# Patient Record
Sex: Female | Born: 1985 | Race: White | Hispanic: No | Marital: Single | Smoking: Former smoker
Health system: Southern US, Community
[De-identification: ages and names within clinical notes are randomized; demographics above are authoritative.]

## PROBLEM LIST (undated history)

## (undated) DIAGNOSIS — Z8669 Personal history of other diseases of the nervous system and sense organs: Secondary | ICD-10-CM

## (undated) DIAGNOSIS — R519 Headache, unspecified: Secondary | ICD-10-CM

## (undated) DIAGNOSIS — K219 Gastro-esophageal reflux disease without esophagitis: Secondary | ICD-10-CM

## (undated) DIAGNOSIS — F1911 Other psychoactive substance abuse, in remission: Secondary | ICD-10-CM

## (undated) DIAGNOSIS — Z8661 Personal history of infections of the central nervous system: Secondary | ICD-10-CM

## (undated) DIAGNOSIS — Z87448 Personal history of other diseases of urinary system: Secondary | ICD-10-CM

## (undated) DIAGNOSIS — R51 Headache: Secondary | ICD-10-CM

## (undated) DIAGNOSIS — J302 Other seasonal allergic rhinitis: Secondary | ICD-10-CM

## (undated) DIAGNOSIS — Z87440 Personal history of urinary (tract) infections: Secondary | ICD-10-CM

## (undated) HISTORY — DX: Personal history of other diseases of urinary system: Z87.448

## (undated) HISTORY — DX: Personal history of urinary (tract) infections: Z87.440

## (undated) HISTORY — DX: Other seasonal allergic rhinitis: J30.2

## (undated) HISTORY — DX: Personal history of other diseases of the nervous system and sense organs: Z86.69

## (undated) HISTORY — DX: Other psychoactive substance abuse, in remission: F19.11

## (undated) HISTORY — PX: TONSILLECTOMY: SUR1361

## (undated) HISTORY — DX: Gastro-esophageal reflux disease without esophagitis: K21.9

## (undated) HISTORY — DX: Personal history of infections of the central nervous system: Z86.61

## (undated) HISTORY — DX: Headache, unspecified: R51.9

## (undated) HISTORY — DX: Headache: R51

---

## 2007-08-28 ENCOUNTER — Emergency Department: Payer: Self-pay | Admitting: Emergency Medicine

## 2008-09-14 ENCOUNTER — Emergency Department: Payer: Self-pay | Admitting: Emergency Medicine

## 2010-10-05 ENCOUNTER — Ambulatory Visit: Payer: Self-pay | Admitting: Internal Medicine

## 2010-10-19 ENCOUNTER — Ambulatory Visit: Payer: Self-pay | Admitting: Internal Medicine

## 2011-01-06 ENCOUNTER — Inpatient Hospital Stay: Payer: Self-pay | Admitting: Psychiatry

## 2011-06-06 DIAGNOSIS — Z87448 Personal history of other diseases of urinary system: Secondary | ICD-10-CM

## 2011-06-06 HISTORY — DX: Personal history of other diseases of urinary system: Z87.448

## 2013-06-05 DIAGNOSIS — F1911 Other psychoactive substance abuse, in remission: Secondary | ICD-10-CM

## 2013-06-05 HISTORY — DX: Other psychoactive substance abuse, in remission: F19.11

## 2014-07-01 ENCOUNTER — Emergency Department: Payer: Self-pay | Admitting: Emergency Medicine

## 2015-03-02 ENCOUNTER — Ambulatory Visit (INDEPENDENT_AMBULATORY_CARE_PROVIDER_SITE_OTHER): Payer: 59 | Admitting: Family Medicine

## 2015-03-02 ENCOUNTER — Encounter (INDEPENDENT_AMBULATORY_CARE_PROVIDER_SITE_OTHER): Payer: Self-pay

## 2015-03-02 ENCOUNTER — Encounter: Payer: Self-pay | Admitting: Family Medicine

## 2015-03-02 VITALS — BP 108/60 | HR 72 | Temp 98.2°F | Ht 59.0 in | Wt 114.8 lb

## 2015-03-02 DIAGNOSIS — F1911 Other psychoactive substance abuse, in remission: Secondary | ICD-10-CM

## 2015-03-02 DIAGNOSIS — B351 Tinea unguium: Secondary | ICD-10-CM

## 2015-03-02 DIAGNOSIS — M2011 Hallux valgus (acquired), right foot: Secondary | ICD-10-CM

## 2015-03-02 DIAGNOSIS — Q845 Enlarged and hypertrophic nails: Secondary | ICD-10-CM

## 2015-03-02 DIAGNOSIS — F1990 Other psychoactive substance use, unspecified, uncomplicated: Secondary | ICD-10-CM

## 2015-03-02 DIAGNOSIS — L602 Onychogryphosis: Secondary | ICD-10-CM | POA: Insufficient documentation

## 2015-03-02 DIAGNOSIS — K219 Gastro-esophageal reflux disease without esophagitis: Secondary | ICD-10-CM | POA: Diagnosis not present

## 2015-03-02 DIAGNOSIS — F1120 Opioid dependence, uncomplicated: Secondary | ICD-10-CM | POA: Insufficient documentation

## 2015-03-02 DIAGNOSIS — M21611 Bunion of right foot: Secondary | ICD-10-CM

## 2015-03-02 DIAGNOSIS — F1121 Opioid dependence, in remission: Secondary | ICD-10-CM

## 2015-03-02 DIAGNOSIS — Z720 Tobacco use: Secondary | ICD-10-CM

## 2015-03-02 DIAGNOSIS — F17200 Nicotine dependence, unspecified, uncomplicated: Secondary | ICD-10-CM | POA: Insufficient documentation

## 2015-03-02 MED ORDER — TERBINAFINE HCL 250 MG PO TABS: 250.0000 mg | ORAL_TABLET | Freq: Every day | ORAL | Status: DC

## 2015-03-02 MED ORDER — OMEPRAZOLE 40 MG PO CPDR: 40.0000 mg | DELAYED_RELEASE_CAPSULE | Freq: Every day | ORAL | Status: DC

## 2015-03-02 NOTE — Progress Notes (Signed)
BP 108/60 mmHg  Pulse 72  Temp(Src) 98.2 F (36.8 C) (Oral)  Ht  (1.499 m)  Wt 114 lb 12 oz (52.05 kg)  BMI 23.16 kg/m2  LMP 11/30/2014 (Approximate)   CC: new pt to establish care  Subjective:    Patient ID: Barney Drain, female    DOB: June 21, 1985, 29 y.o.   MRN: [id number]  HPI: Halo Shevlin is a 29 y.o. female presenting on 03/02/2015 for Establish Care   On methadone for h/o narcotic dependence and opiate abuse. [facility]ed [facility]. Thinks liver function checked in June.  GERD - on nexium since age 78 yo. Ran out 3 yrs ago, has been taking zantac but get sbreakthrough sxs. Describes chest tightness, burn, burping, acid reflux with nausea. Globus sensation. Some epigastric pain. No dysphagia, vomiting. Knows to avoid spicy foods, citruses. Intermittent constipation BM Q3-4. No blood in stool, no diarrhea. fmhx chrohn's disease.   Foot issues over last 4-6 months - thickening of nails, discoloration, and possible bunion. Tried funginail for 1 month.   Has not seen podiatrist.   Smoking - 1 ppd.   Preventative: Last CPE was years ago Flu shot - declines  Lives with mom and boyfriend, 1 dog, 6 cats 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[employer] Activity: no regular exercise - remodeling house Diet: some water, vegetables regularly  Relevant past medical, surgical, family and social history reviewed and updated as indicated. Interim medical history since our last visit reviewed. Allergies and medications reviewed and updated. No current outpatient prescriptions on file prior to visit.   No current facility-administered medications on file prior to visit.    Review of Systems Per HPI unless specifically indicated above     Objective:    BP 108/60 mmHg  Pulse 72  Temp(Src) 98.2 F (36.8 C) (Oral)  Ht  (1.499 m)  Wt 114 lb 12 oz (52.05 kg)  BMI 23.16 kg/m2  LMP 11/30/2014 (Approximate)  Wt Readings from Last 3 Encounters:  03/02/15 114 lb 12 oz (52.05  kg)    Physical Exam  Constitutional: She appears well-developed and well-nourished. No distress.  HENT:  Mouth/Throat: Oropharynx is clear and moist. No oropharyngeal exudate.  Eyes: Conjunctivae and EOM are normal. Pupils are equal, round, and reactive to light. No scleral icterus.  Neck: Normal range of motion. Neck supple. No thyromegaly present.  Cardiovascular: Normal rate, regular rhythm, normal heart sounds and intact distal pulses.   No murmur heard. Pulmonary/Chest: Effort normal and breath sounds normal. No respiratory distress. She has no wheezes. She has no rales.  Abdominal: Soft. Normal appearance and bowel sounds are normal. She exhibits no distension. There is no hepatosplenomegaly. There is tenderness (mild) in the right upper quadrant, epigastric area and left lower quadrant. There is no rigidity, no rebound, no guarding and negative Murphy's sign.  Musculoskeletal: She exhibits no edema.  2+ DP bilaterally Thickened onychomycotic R great toenail, L 5th toenail, elongated nails throughout, some pincer nails with deformity Medial hypertrophy of R MTP present anticipate bunion  Lymphadenopathy:    She has no cervical adenopathy.  Skin: Skin is warm and dry. No rash noted.  Psychiatric: She has a normal mood and affect.  Nursing note and vitals reviewed.  No results found for this or any previous visit.    Assessment & Plan:   Problem List Items Addressed This Visit    Thickened nails    Refer to podiatry      Relevant Orders   Ambulatory  referral to Podiatry   Smoker    1 ppd, precontemplative.      Opiate dependence    [facility]. Clean for 42yr 54mo.      Onychomycosis - Primary    R great toenail as well as L 5th digit Discussed topical vs oral antifungal. Discussed possible side effects of oral antifungal.  Provided with Rx for terbinafine 60d course - but to wait for podiatry recs first. Discussed need to monitor  liver function of persistent need for terbinafine. Pt states she had normal liver function 11/2014 at methadone clinic - will bring me latest lab report to review      Relevant Medications   terbinafine (LAMISIL) 250 MG tablet   Other Relevant Orders   Ambulatory referral to Podiatry   H/O drug abuse   GERD (gastroesophageal reflux disease)    Discussed dietary choices to control GERD. Breakthrough sxs despite daily zantac. Start omeprazole  daily.       Relevant Medications   omeprazole (PRILOSEC) 40 MG capsule   Bunion of great toe of right foot       Follow up plan: Return previously scheduled appointment.

## 2015-03-02 NOTE — Assessment & Plan Note (Signed)
Maintained on meth[facility]one by [facility]. Clean for 94yr 70mo.

## 2015-03-02 NOTE — Patient Instructions (Addendum)
Schedule physical for Dec 6th. Try colace for stool. Increase water and fiber in diet, consider prune juice.  Bring me copy of latest labs from June to review.  Terbinafine antifungal prescription provided today. But pass by Marion's office for referral to podiatry and see what they recommend first. Nice to meet you today, call us with questions

## 2015-03-02 NOTE — Assessment & Plan Note (Signed)
R great toenail as well as L 5th digit Discussed topical vs oral antifungal. Discussed possible side effects of oral antifungal.  Provided with Rx for terbinafine 60d course - but to wait for podiatry recs first. Discussed need to monitor liver function of persistent need for terbinafine. Pt states she had normal liver function 11/2014 at methadone clinic - will bring me latest lab report to review

## 2015-03-02 NOTE — Progress Notes (Signed)
Pre visit review using our clinic review tool, if applicable. No additional management support is needed unless otherwise documented below in the visit note. 

## 2015-03-02 NOTE — Assessment & Plan Note (Signed)
Refer to podiatry

## 2015-03-02 NOTE — Assessment & Plan Note (Signed)
Discussed dietary choices to control GERD. Breakthrough sxs despite daily zantac. Start omeprazole  daily.

## 2015-03-02 NOTE — Assessment & Plan Note (Signed)
1 ppd, precontemplative.

## 2015-03-10 ENCOUNTER — Ambulatory Visit (INDEPENDENT_AMBULATORY_CARE_PROVIDER_SITE_OTHER): Payer: 59

## 2015-03-10 ENCOUNTER — Ambulatory Visit (INDEPENDENT_AMBULATORY_CARE_PROVIDER_SITE_OTHER): Payer: 59 | Admitting: Podiatry

## 2015-03-10 ENCOUNTER — Encounter: Payer: Self-pay | Admitting: Podiatry

## 2015-03-10 VITALS — BP 110/68 | HR 82 | Resp 18

## 2015-03-10 DIAGNOSIS — R52 Pain, unspecified: Secondary | ICD-10-CM | POA: Diagnosis not present

## 2015-03-10 DIAGNOSIS — B351 Tinea unguium: Secondary | ICD-10-CM | POA: Diagnosis not present

## 2015-03-10 DIAGNOSIS — L603 Nail dystrophy: Secondary | ICD-10-CM | POA: Diagnosis not present

## 2015-03-10 DIAGNOSIS — M2011 Hallux valgus (acquired), right foot: Secondary | ICD-10-CM | POA: Diagnosis not present

## 2015-03-10 NOTE — Progress Notes (Signed)
   Subjective:    Patient ID: Sonya Finley, female    DOB: 11/28/1985, 29 y.o.   MRN: [id number]  HPI: She presents today in referral from Dr. Sharen Hones for chief complaint of abnormally shaped nails and thick and discolored mycotic nails. She has been given a prescription for terbinafine but was told to visit [place] for confirmation prior to her taking it. She is also presenting for painful bunion right foot. She states this been present for about 5 years and is getting worse. Closed toe shoes bother this and she would like to have it removed if possible. She states that it affects her ability to perform her daily activities. She has a history of heroin addiction currently she's been clean for year and a half and is on methadone. She doesn't methadone clinic twice a week.    Review of Systems  Constitutional: Positive for fatigue.  HENT: Positive for sinus pressure.   Gastrointestinal: Positive for abdominal pain and constipation.       Bloating  Skin:       Change in nails  Allergic/Immunologic: Positive for environmental allergies.  All other systems reviewed and are negative.      Objective:   Physical Exam: 29 year old white female in no apparent distress vital signs stable alert and oriented 3. Pulses are strongly palpable. Neurologic sensorium is intact per Semmes-Weinstein monofilament. Deep tendon reflexes are intact. Muscle strength intact bilateral. Orthopedic evaluation demonstrates all joints distal to the ankle range of motion without crepitation. 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 Range of motion is painful and pain she also has pain on palpation to the hypertrophic medial condyle of the head of the first metatarsal. 3 views of the right foot demonstrates an increase in the first metatarsal angle greater than normal values. Hallux abductus angle greater than normal value resulting in overlap of the  second toe. Cutaneous evaluation demonstrates thick yellow dystrophic nails which are painful on palpation as well as debridement. Nails skin was sampled today.        Assessment & Plan:  Nail dystrophy bilateral foot. 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  Plan: Discussed etiology pathology conservative versus surgical therapies. At this point I took a sample of the nail and skin to be sent for pathologic evaluation I will follow up with her once this comes in. At that time hopefully she will have discussed with her primary doctor and her methadone clinic advisor that we will perform surgical intervention regarding this right foot. I will follow up with her at that time for this discussion and consult.

## 2015-03-11 NOTE — Addendum Note (Signed)
Addended by: Hadley Pen R on: 03/11/2015 08:35 AM   Modules accepted: Orders

## 2015-03-24 ENCOUNTER — Telehealth: Payer: Self-pay | Admitting: *Deleted

## 2015-03-24 ENCOUNTER — Encounter: Payer: Self-pay | Admitting: *Deleted

## 2015-03-24 NOTE — Telephone Encounter (Signed)
Called pt- L/M to call and schedule appt to discuss positive fungal culture and treatment.

## 2015-04-05 ENCOUNTER — Telehealth: Payer: Self-pay | Admitting: *Deleted

## 2015-04-07 ENCOUNTER — Encounter: Payer: Self-pay | Admitting: Podiatry

## 2015-04-07 ENCOUNTER — Ambulatory Visit (INDEPENDENT_AMBULATORY_CARE_PROVIDER_SITE_OTHER): Payer: 59 | Admitting: Podiatry

## 2015-04-07 VITALS — BP 103/62 | HR 101 | Resp 16

## 2015-04-07 DIAGNOSIS — Z79899 Other long term (current) drug therapy: Secondary | ICD-10-CM

## 2015-04-07 DIAGNOSIS — L603 Nail dystrophy: Secondary | ICD-10-CM

## 2015-04-07 MED ORDER — TERBINAFINE HCL 250 MG PO TABS: 250.0000 mg | ORAL_TABLET | Freq: Every day | ORAL | Status: DC

## 2015-04-07 NOTE — Patient Instructions (Signed)

## 2015-04-08 NOTE — Progress Notes (Signed)
She presents today for her nail culture results. No change in the nail plates she states.  Objective: Vital signs are stable she is alert and oriented 3. I have reviewed her past medical history medications allergies surgeries and social history. Her nail plates are thick yellow dystrophic onychomycotic and according to our culture results they'll do demonstrate onychomycosis of the nail plate.  Assessment: Onychomycosis.  Plan: We will get her started on Lamisil 250 mg tablets 1 by mouth daily at bedtime #30 and request a liver profile. Should this profile come back and we'll we will notify her immediately. Otherwise I will follow up with her in 1 month. We did discuss the possible side effects of this medication she understands this is amenable to it and will notify us should any occur.  Arbutus Pedodd Hyatt DPM

## 2015-05-04 ENCOUNTER — Telehealth: Payer: Self-pay | Admitting: *Deleted

## 2015-05-04 NOTE — Telephone Encounter (Signed)
Pt asked that the lamisil be called in again, she had not been able to get labs.  I told pt the medication rx was sent to her pharmacy and should be able to pick it up once she had completed her labs and been given instructions after the labs were reviewed.

## 2015-05-05 ENCOUNTER — Other Ambulatory Visit: Payer: Self-pay | Admitting: Family Medicine

## 2015-05-05 ENCOUNTER — Ambulatory Visit: Payer: 59 | Admitting: Podiatry

## 2015-05-05 DIAGNOSIS — Z13220 Encounter for screening for lipoid disorders: Secondary | ICD-10-CM

## 2015-05-05 DIAGNOSIS — B351 Tinea unguium: Secondary | ICD-10-CM

## 2015-05-06 ENCOUNTER — Other Ambulatory Visit: Payer: 59

## 2015-05-11 ENCOUNTER — Encounter: Payer: 59 | Admitting: Family Medicine

## 2015-05-11 ENCOUNTER — Ambulatory Visit: Payer: Self-pay | Admitting: Family Medicine

## 2015-06-09 ENCOUNTER — Encounter: Payer: 59 | Admitting: Podiatry

## 2015-07-06 NOTE — Progress Notes (Signed)
This encounter was created in error - please disregard.

## 2017-04-13 ENCOUNTER — Other Ambulatory Visit: Payer: Self-pay

## 2017-04-13 ENCOUNTER — Emergency Department
Admission: EM | Admit: 2017-04-13 | Discharge: 2017-04-13 | Disposition: A | Discharge status: Home / Self Care | Payer: 59 | Attending: Emergency Medicine | Admitting: Emergency Medicine

## 2017-04-13 ENCOUNTER — Encounter: Payer: Self-pay | Admitting: Emergency Medicine

## 2017-04-13 DIAGNOSIS — K029 Dental caries, unspecified: Secondary | ICD-10-CM | POA: Insufficient documentation

## 2017-04-13 DIAGNOSIS — F17210 Nicotine dependence, cigarettes, uncomplicated: Secondary | ICD-10-CM | POA: Insufficient documentation

## 2017-04-13 DIAGNOSIS — K047 Periapical abscess without sinus: Secondary | ICD-10-CM | POA: Insufficient documentation

## 2017-04-13 DIAGNOSIS — Z79899 Other long term (current) drug therapy: Secondary | ICD-10-CM | POA: Insufficient documentation

## 2017-04-13 MED ORDER — PENICILLIN V POTASSIUM 500 MG PO TABS: 500.0000 mg | ORAL_TABLET | Freq: Four times a day (QID) | ORAL | 0 refills | Status: DC

## 2017-04-13 MED ORDER — PENICILLIN V POTASSIUM 500 MG PO TABS
500.0000 mg | ORAL_TABLET | Freq: Once | ORAL | Status: AC
  Administered 2017-04-13: 500 mg via ORAL
  Filled 2017-04-13: qty 1

## 2017-04-13 NOTE — ED Triage Notes (Signed)
Pt c/o pain/swelling around left lower teeth. Poor dentition. No insurance/dentist.  Reports here to get abx.

## 2017-04-13 NOTE — ED Provider Notes (Signed)
[facility] Emergency Department Provider Note ____________________________________________  Time seen: 1034  I have reviewed the triage vital signs and the nursing notes.  HISTORY  Chief Complaint  Dental Pain  HPI Sonya Finley is a 31 y.o. female presents to the ED for evaluation of left lower jaw pain and swelling. She reports poor dentition, globally. She has no dental insurance or recent dental procedures. She denies any fevers, chills, nausea, or spontaneous drainage.   Past Medical History:  Diagnosis Date  . Frequent headaches   . GERD (gastroesophageal reflux disease)   . H/O drug abuse 2015   heroine, [facility]  . History of migraine more as teenager  . History of pyelonephritis 2013  . History of UTI    last 2015  . Hx of viral meningitis ~1995/96   hospitalized for 1 wk  . Seasonal allergies     Patient Active Problem List   Diagnosis Date Noted  . Onychomycosis 03/02/2015  . Thickened nails 03/02/2015  . GERD (gastroesophageal reflux disease) 03/02/2015  . Opiate dependence (HCC) 03/02/2015  . Bunion of great toe of right foot 03/02/2015  . Smoker 03/02/2015  . H/O drug abuse     Past Surgical History:  Procedure Laterality Date  . TONSILLECTOMY  ~0981/19~1994/95    Prior to Admission medications   Medication Sig Start Date End Date Taking? Authorizing Provider  METHADONE HCL PO Take 120 mg by mouth daily.    [provider]  omeprazole (PRILOSEC) 40 MG capsule Take 1 capsule (40 mg total) by mouth daily. 03/02/15   Eustaquio BoydenGutierrez, Javier, MD  penicillin v potassium (VEETID) 500 MG tablet Take 1 tablet (500 mg total) 4 (four) times daily by mouth. 04/13/17   Nilay Mangrum, Charlesetta IvoryJenise V Bacon, PA-C  terbinafine (LAMISIL) 250 MG tablet Take 1 tablet (250 mg total) by mouth daily. 04/07/15   Hyatt, Max T, DPM    Allergies Patient has no known allergies.  Family History  Problem Relation Age of Onset  .  Crohn's disease Mother   . Crohn's disease Maternal Aunt   . CAD Maternal Grandmother   . Alcohol abuse Father   . Drug abuse Father   . Cancer Neg Hx   . Diabetes Neg Hx     Social History Social History   Tobacco Use  . Smoking status: Current Every Day Smoker    Packs/day: 1.00    Types: Cigarettes    Start date: 06/05/2002  . Smokeless tobacco: Never Used  Substance Use Topics  . Alcohol use: No    Alcohol/week: 0.0 oz  . Drug use: No    Comment: h/o heroine use    Review of Systems  Constitutional: Negative for fever. Eyes: Negative for visual changes. ENT: Negative for sore throat. Dental pain as above.  Cardiovascular: Negative for chest pain. Respiratory: Negative for shortness of breath. Gastrointestinal: Negative for abdominal pain, vomiting and diarrhea. Skin: Negative for rash. Neurological: Negative for headaches, focal weakness or numbness. ____________________________________________  PHYSICAL EXAM:  VITAL SIGNS: ED Triage Vitals [04/13/17 0914]  Enc Vitals Group     BP 136/89     Pulse Rate 90     Resp 16     Temp 98.4 F (36.9 C)     Temp Source Oral     SpO2 100 %     Weight 125 lb (56.7 kg)     Height 4\' 11"  (1.499 m)     Head Circumference  Peak Flow      Pain Score 6     Pain Loc      Pain Edu?      Excl. in GC?     Constitutional: Alert and oriented. Well appearing and in no distress. Head: Normocephalic and atraumatic. Eyes: Conjunctivae are normal. PERRL. Normal extraocular movements Mouth/Throat: Mucous membranes are moist. Uvula is midline and tonsils are flat. Poor dentition globally. Lower jaw with pre-molars and molars decayed to the pulp. Focal left lower jaw swelling without fluctuance, pointing, or spontaneous drainage.  Neck: Supple. No thyromegaly. Hematological/Lymphatic/Immunological: No cervical lymphadenopathy. Cardiovascular: Normal rate, regular rhythm. Normal distal pulses. Respiratory: Normal respiratory  effort. No wheezes/rales/rhonchi. Skin:  Skin is warm, dry and intact. No rash noted. ____________________________________________  PROCEDURES  Pen VK 500 mg PO ____________________________________________  INITIAL IMPRESSION / ASSESSMENT AND PLAN / ED COURSE  Patient presents to the ED for evaluation of lower dental pain and swelling.  Patient with globally poor dentition, presents with a focal dental abscess.  Her exam is overall benign without any fluctuance, pointing, or drainable abscess at this time.  She is discharged with a prescription for Pen-Vee K to dose as directed.  Is given a list of dental clinics for definitive management.  Return precautions are reviewed. ____________________________________________  FINAL CLINICAL IMPRESSION(S) / ED DIAGNOSES  Final diagnoses:  Pain due to dental caries  Dental infection     Lissa HoardMenshew, Taisley Mordan V Bacon, PA-C 04/13/17 1110    Rockne MenghiniNorman, Anne-Caroline, MD 04/16/17 2150

## 2017-04-13 NOTE — Discharge Instructions (Signed)
Take the antibiotic as directed. Follow-up with one of the dental clinic for definitive management.  OPTIONS FOR DENTAL FOLLOW UP CARE  [place] Department of Health and Human Services - Local Safety Net Dental Clinics [email or web address]://www.ncdhhs.gov/dph/oralhealth/services/safetynetclinics.htm   [facility] [phone])  [facility] [phone])  [facility] [phone]  [facility] [phone])  [facility] [phone]) This clinic caters to the indigent population and is on a lottery system. Location: [employer] [employer] [facility] [address] [facility], [address], [place] [facility] Hours: Wednesdays from 6pm - 9pm, patients seen by a lottery system. For dates, call or go to [email or web address] Services: Cleanings, fillings and simple extractions. Payment Options: DENTAL WORK IS FREE OF CHARGE. Bring proof of income or support. Best way to get seen: Arrive at 5:15 pm - this is a lottery, NOT first come/first serve, so arriving earlier will not increase your chances of being seen.     [facility] [phone] Select option 1 for emergencies   Location: [facility] [place], [address], [place] [facility] Hours: No walk-ins accepted - call the day before to schedule an appointment. Check in times are 9:30 am and 1:30 pm. Services: Simple extractions, temporary fillings, pulpectomy/pulp debridement, uncomplicated abscess drainage. Payment Options: PAYMENT IS DUE AT THE TIME OF SERVICE.  Fee is usually $100-200, additional surgical procedures (e.g. abscess drainage) may be extra. Cash, checks, Visa/MasterCard accepted.  Can file Medicaid if patient is covered for dental - patient should call case worker to check. No discount for [facility] patients. Best way to get seen: MUST call the day before and get onto the schedule. Can usually be seen  the next 1-2 days. No walk-ins accepted.     [facility] [phone]   Location: [facility], [address], [place] [facility] Hours: M, W, Th, F 8am or 1:30pm, Tues 9a or 1:30 - first come/first served. Services: Simple extractions, temporary fillings, uncomplicated abscess drainage.  You do not need to be an [place] resident. Payment Options: PAYMENT IS DUE AT THE TIME OF SERVICE. Dental insurance, otherwise sliding scale - bring proof of income or support. Depending on income and treatment needed, cost is usually $50-200. Best way to get seen: Arrive early as it is first come/first served.     [facility] [phone]   Location: [address] Clinic Hours: Mon-Thu 8a-5p Services: Most basic dental services including extractions and fillings. Payment Options: PAYMENT IS DUE AT THE TIME OF SERVICE. Sliding scale, up to 50% off - bring proof if income or support. Medicaid with dental option accepted. Best way to get seen: Call to schedule an appointment, can usually be seen within 2 weeks OR they will try to see walk-ins - show up at 8a or 2p (you may have to wait).     [facility] [phone] [place] RESIDENTS ONLY   Location: [facility], [address], [place], [place] [address] Clinic Hours: By appointment only. Monday - Thursday 8am-5pm, Friday 8am-12pm Services: Cleanings, fillings, extractions. Payment Options: PAYMENT IS DUE AT THE TIME OF SERVICE. Cash, Visa or MasterCard. Sliding scale - $30 minimum per service. Best way to get seen: Come in to office, complete packet and make an appointment - need proof of income or support monies for each household member and proof of [place] [facility] [place] residence. Usually takes about a month to get in.  [facility] [phone]   Location: [address]  [facility] Hours: Walk-in Urgent Care Dental Services are offered Monday-Friday mornings only. The numbers of emergencies accepted daily is limited to the number of providers available. Maximum 15 - Mondays, Wednesdays & Thursdays Maximum 10 - Tuesdays & Fridays Services: You do not need to be a [place] resident to be seen for a dental emergency. Emergencies are defined as pain, swelling, abnormal bleeding, or dental trauma. Walkins will receive x-rays if needed. NOTE: Dental cleaning is not an emergency. Payment Options: PAYMENT IS DUE AT THE TIME OF SERVICE. Minimum co-pay is $40.00 for uninsured patients. Minimum co-pay is $3.00 for Medicaid with dental coverage. Dental Insurance is accepted and must be presented at time of visit. Medicare does not cover dental. Forms of payment: Cash, credit card, checks. Best way to get seen: If not previously registered with the clinic, walk-in dental registration begins at 7:15 am and is on a first come/first serve basis. If previously registered with the clinic, call to make an appointment.     [facility] [phone] [place] RESIDENTS ONLY   Location: [address], [place], [place] Clinic Hours: Mon-Thu 10a-2p Services: Extractions only! Payment Options: FREE (donations accepted) - bring proof of income or support Best way to get seen: Call and schedule an appointment OR come at 8am on the 1st Monday of every month (except for holidays) when it is first come/first served.     [facility] [phone]   Location: [address], [place] Hours: Friday mornings Services, Payment Options, Best way to get seen: Call for info

## 2018-01-29 ENCOUNTER — Encounter: Payer: Self-pay | Admitting: Emergency Medicine

## 2018-01-29 ENCOUNTER — Other Ambulatory Visit: Payer: Self-pay

## 2018-01-29 ENCOUNTER — Emergency Department
Admission: EM | Admit: 2018-01-29 | Discharge: 2018-01-29 | Disposition: A | Discharge status: Home / Self Care | Payer: Self-pay | Attending: Emergency Medicine | Admitting: Emergency Medicine

## 2018-01-29 DIAGNOSIS — Z79899 Other long term (current) drug therapy: Secondary | ICD-10-CM | POA: Insufficient documentation

## 2018-01-29 DIAGNOSIS — K029 Dental caries, unspecified: Secondary | ICD-10-CM | POA: Insufficient documentation

## 2018-01-29 DIAGNOSIS — F17210 Nicotine dependence, cigarettes, uncomplicated: Secondary | ICD-10-CM | POA: Insufficient documentation

## 2018-01-29 MED ORDER — AMOXICILLIN 500 MG PO CAPS
500.0000 mg | ORAL_CAPSULE | Freq: Once | ORAL | Status: AC
  Administered 2018-01-29: 500 mg via ORAL
  Filled 2018-01-29: qty 1

## 2018-01-29 MED ORDER — AMOXICILLIN 500 MG PO CAPS: 500.0000 mg | ORAL_CAPSULE | Freq: Three times a day (TID) | ORAL | 0 refills | Status: DC

## 2018-01-29 MED ORDER — TRAMADOL HCL 50 MG PO TABS: 50.0000 mg | ORAL_TABLET | Freq: Two times a day (BID) | ORAL | 0 refills | Status: DC

## 2018-01-29 MED ORDER — TRAMADOL HCL 50 MG PO TABS
50.0000 mg | ORAL_TABLET | Freq: Once | ORAL | Status: AC
  Administered 2018-01-29: 50 mg via ORAL
  Filled 2018-01-29: qty 1

## 2018-01-29 NOTE — ED Triage Notes (Signed)
Pt with lower right dental pain.

## 2018-01-29 NOTE — Discharge Instructions (Addendum)
Take the antibiotic as directed and the pain medicine as needed. Follow-up with [facility], or one of the dental clinics listed below.   OPTIONS FOR DENTAL FOLLOW UP CARE  [place] Department of Health and Human Services - Local Safety Net Dental Clinics [email or web address]://www.ncdhhs.gov/dph/oralhealth/services/safetynetclinics.htm   [facility] [phone])  [facility] [phone])  [facility] [phone]  [facility] [phone])  [facility] [phone]) This clinic caters to the indigent population and is on a lottery system. Location: [employer], [address], [address], [place] [facility] Hours: Wednesdays from 6pm - 9pm, patients seen by a lottery system. For dates, call or go to [email or web address] Services: Cleanings, fillings and simple extractions. Payment Options: DENTAL WORK IS FREE OF CHARGE. Bring proof of income or support. Best way to get seen: Arrive at 5:15 pm - this is a lottery, NOT first come/first serve, so arriving earlier will not increase your chances of being seen.     [facility] [phone] Select option 1 for emergencies   Location: [facility], [place] [address], [address], [place] [facility] Hours: No walk-ins accepted - call the day before to schedule an appointment. Check in times are 9:30 am and 1:30 pm. Services: Simple extractions, temporary fillings, pulpectomy/pulp debridement, uncomplicated abscess drainage. Payment Options: PAYMENT IS DUE AT THE TIME OF SERVICE.  Fee is usually $100-200, additional surgical procedures (e.g. abscess drainage) may be extra. Cash, checks, Visa/MasterCard accepted.  Can file Medicaid if patient is covered for dental - patient should call case worker to check. No discount for [facility] patients. Best way to get seen: MUST call the day before and  get onto the schedule. Can usually be seen the next 1-2 days. No walk-ins accepted.     [facility] [phone]   Location: [facility], [address], [place] [facility] Hours: M, W, Th, F 8am or 1:30pm, Tues 9a or 1:30 - first come/first served. Services: Simple extractions, temporary fillings, uncomplicated abscess drainage.  You do not need to be an [facility] [place] [facility] [place] resident. Payment Options: PAYMENT IS DUE AT THE TIME OF SERVICE. Dental insurance, otherwise sliding scale - bring proof of income or support. Depending on income and treatment needed, cost is usually $50-200. Best way to get seen: Arrive early as it is first come/first served.     [facility] [phone]   Location: [address] Clinic Hours: Mon-Thu 8a-5p Services: Most basic dental services including extractions and fillings. Payment Options: PAYMENT IS DUE AT THE TIME OF SERVICE. Sliding scale, up to 50% off - bring proof if income or support. Medicaid with dental option accepted. Best way to get seen: Call to schedule an appointment, can usually be seen within 2 weeks OR they will try to see walk-ins - show up at 8a or 2p (you may have to wait).     [facility] [phone] [place] RESIDENTS ONLY   Location: [facility], [address], [place], [place] [address] Clinic Hours: By appointment only. Monday - Thursday 8am-5pm, Friday 8am-12pm Services: Cleanings, fillings, extractions. Payment Options: PAYMENT IS DUE AT THE TIME OF SERVICE. Cash, Visa or MasterCard. Sliding scale - $30 minimum per service. Best way to get seen: Come in to office, complete packet and make an appointment - need proof of income or support monies for each household member and proof of [place]. Usually takes about a month to get in.     [facility] [phone]   Location: [address], [place] Clinic Hours: Walk-in Urgent Care Dental Services are offered Monday-Friday mornings only. The numbers of emergencies accepted daily is limited to the number of providers available. Maximum 15 - Mondays, Wednesdays & Thursdays Maximum 10 - Tuesdays & Fridays Services: You do not need to be a [facility] resident to be seen for a dental emergency. Emergencies are defined as pain, swelling, abnormal bleeding, or dental trauma. Walkins will receive x-rays if needed. NOTE: Dental cleaning is not an emergency. Payment Options: PAYMENT IS DUE AT THE TIME OF SERVICE. Minimum co-pay is $40.00 for uninsured patients. Minimum co-pay is $3.00 for Medicaid with dental coverage. Dental Insurance is accepted and must be presented at time of visit. Medicare does not cover dental. Forms of payment: Cash, credit card, checks. Best way to get seen: If not previously registered with the clinic, walk-in dental registration begins at 7:15 am and is on a first come/first serve basis. If previously registered with the clinic, call to make an appointment.     [facility] [place] ONLY   Location: [address] [place], [place] Clinic Hours: Mon-Thu 10a-2p Services: Extractions only! Payment Options: FREE (donations accepted) - bring proof of income or support Best way to get seen: Call and schedule an appointment OR come at 8am on the 1st Monday of every month (except for holidays) when it is first come/first served.     [facility] [phone]   Location: [place], [place] Clinic Hours: Friday mornings Services, Payment Options, Best way to get seen: Call for info

## 2018-01-29 NOTE — ED Notes (Signed)
See triage note  Presents with dental pain  States her tooth on right lower has been broken for a while  But pain became more intense this am

## 2018-01-29 NOTE — ED Provider Notes (Signed)
[facility] Emergency Department Provider Note ____________________________________________  Time seen: 1721  I have reviewed the triage vital signs and the nursing notes.  HISTORY  Chief Complaint  Dental Pain  HPI Sonya Finley is a 32 y.o. female resents herself to the ED for evaluation of dental pain.  Patient with immediately poor dentition, presents with pain to the right lower jaw as well as some fullness and swelling to the left lower jaw.  She denies any interim fevers, chills, or sweats.  The patient also denies any spontaneous drainage, difficulty breathing, controlling secretions, or eating.  She reports pain to the right lower jaw at her only remaining molar, which subsequently is broken and decayed to the gumline.  She also notes some intermittent swelling to the left lower jaw.  Past Medical History:  Diagnosis Date  . Frequent headaches   . GERD (gastroesophageal reflux disease)   . 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 History of migraine more as teenager  . History of pyelonephritis 2013  . History of UTI    last 2015  . Hx of viral meningitis ~1995/96   hospitalized for 1 wk  . Seasonal allergies     Patient Active Problem List   Diagnosis Date Noted  . Onychomycosis 03/02/2015  . Thickened nails 03/02/2015  . GERD (gastroesophageal reflux disease) 03/02/2015  . Opiate dependence (HCC) 03/02/2015  . Bunion of great toe of right foot 03/02/2015  . Smoker 03/02/2015  . H/O drug abuse     Past Surgical History:  Procedure Laterality Date  . TONSILLECTOMY  ~1610/96    Prior to Admission medications   Medication Sig Start Date End Date Taking? Authorizing Provider  amoxicillin (AMOXIL) 500 MG capsule Take 1 capsule (500 mg total) by mouth 3 (three) times daily. 01/29/18   Carie Kapuscinski, Charlesetta Ivory, PA-C  METHADONE HCL PO Take 120 mg by mouth daily.    [provider]  omeprazole  (PRILOSEC) 40 MG capsule Take 1 capsule (40 mg total) by mouth daily. 03/02/15   Eustaquio Boyden, MD  penicillin v potassium (VEETID) 500 MG tablet Take 1 tablet (500 mg total) 4 (four) times daily by mouth. 04/13/17   Antinio Sanderfer, Charlesetta Ivory, PA-C  terbinafine (LAMISIL) 250 MG tablet Take 1 tablet (250 mg total) by mouth daily. 04/07/15   Hyatt, Max T, DPM  traMADol (ULTRAM) 50 MG tablet Take 1 tablet (50 mg total) by mouth 2 (two) times daily. 01/29/18   Dory Verdun, Charlesetta Ivory, PA-C    Allergies Patient has no known allergies.  Family History  Problem Relation Age of Onset  . Crohn's disease Mother   . Crohn's disease Maternal Aunt   . CAD Maternal Grandmother   . Alcohol abuse Father   . Drug abuse Father   . Cancer Neg Hx   . Diabetes Neg Hx     Social History Social History   Tobacco Use  . Smoking status: Current Every Day Smoker    Packs/day: 1.00    Types: Cigarettes    Start date: 06/05/2002  . Smokeless tobacco: Never Used  Substance Use Topics  . Alcohol use: No    Alcohol/week: 0.0 standard drinks  . Drug use: No    Comment: h/o heroine use    Review of Systems  Constitutional: Negative for fever. Eyes: Negative for visual changes. ENT: Negative for sore throat.  Dental pain and jaw swelling as above. Cardiovascular: Negative  for chest pain. Respiratory: Negative for shortness of breath. Gastrointestinal: Negative for abdominal pain, vomiting and diarrhea. Genitourinary: Negative for dysuria. Musculoskeletal: Negative for back pain. Skin: Negative for rash. Neurological: Negative for headaches, focal weakness or numbness. ____________________________________________  PHYSICAL EXAM:  VITAL SIGNS: ED Triage Vitals  Enc Vitals Group     BP 01/29/18 1634 (!) 128/99     Pulse Rate 01/29/18 1634 85     Resp --      Temp 01/29/18 1634 98.1 F (36.7 C)     Temp Source 01/29/18 1634 Oral     SpO2 01/29/18 1634 100 %     Weight --      Height --       Head Circumference --      Peak Flow --      Pain Score 01/29/18 1635 10     Pain Loc --      Pain Edu? --      Excl. in GC? --     Constitutional: Alert and oriented. Well appearing and in no distress. Head: Normocephalic and atraumatic. Eyes: Conjunctivae are normal. PERRL. Normal extraocular movements Ears: Canals clear. TMs intact bilaterally. Nose: No congestion/rhinorrhea/epistaxis. Mouth/Throat: Mucous membranes are moist.  Uvula is midline and tonsils are flat.  No oropharyngeal lesions are appreciated.  Patient with poor dentition noting broken and decayed molars to the upper and lower jaw.  Localized tenderness to the right lower jaw reveals a single decayed, broken 1st molar.  No focal gum tenderness, erythema, or fluctuance.  Patient is also noted to have some fullness to the left lower mandible.  Again there are no focal abscesses, pointing, fluctuance, or gum erythema. Neck: Supple. No thyromegaly. Hematological/Lymphatic/Immunological: No cervical lymphadenopathy. Cardiovascular: Normal rate, regular rhythm. Normal distal pulses. Respiratory: Normal respiratory effort. No wheezes/rales/rhonchi. Gastrointestinal: Soft and nontender. No distention. ____________________________________________  PROCEDURES  Procedures Amoxicillin 500 mg p.o. Tramadol 50 mg p.o. ____________________________________________  INITIAL IMPRESSION / ASSESSMENT AND PLAN / ED COURSE  Patient with ED evaluation of dental pain likely due to dental caries.  Patient will be treated empirically for dental infection.  A prescription for amoxicillin as well as a small prescription for Ultram was provided.  She will [facility] with [facility], or 1 of the local community dental clinics provided.  Return precautions have been reviewed.  I reviewed the patient's prescription history over the last 12 months in the multi-state controlled substances database(s) that includes [place], [place], [place], [place], [place], [place], [place], [place], [place], [place], [place], [place], and [place].  Results were notable for no current prescriptions.  ____________________________________________  FINAL CLINICAL IMPRESSION(S) / ED DIAGNOSES  Final diagnoses:  Pain due to dental caries      Lissa HoardMenshew, Addilyn Satterwhite V Bacon, PA-C 01/29/18 1816    Arnaldo NatalMalinda, Paul F, MD 01/29/18 [id number]

## 2018-07-11 ENCOUNTER — Emergency Department
Admission: EM | Admit: 2018-07-11 | Discharge: 2018-07-11 | Disposition: A | Discharge status: Home / Self Care | Payer: Self-pay | Attending: Emergency Medicine | Admitting: Emergency Medicine

## 2018-07-11 ENCOUNTER — Other Ambulatory Visit: Payer: Self-pay

## 2018-07-11 ENCOUNTER — Encounter: Payer: Self-pay | Admitting: Emergency Medicine

## 2018-07-11 DIAGNOSIS — F17210 Nicotine dependence, cigarettes, uncomplicated: Secondary | ICD-10-CM | POA: Insufficient documentation

## 2018-07-11 DIAGNOSIS — Z79899 Other long term (current) drug therapy: Secondary | ICD-10-CM | POA: Insufficient documentation

## 2018-07-11 DIAGNOSIS — N61 Mastitis without abscess: Secondary | ICD-10-CM | POA: Insufficient documentation

## 2018-07-11 DIAGNOSIS — L03319 Cellulitis of trunk, unspecified: Secondary | ICD-10-CM | POA: Insufficient documentation

## 2018-07-11 LAB — CBC WITH DIFFERENTIAL/PLATELET
Abs Immature Granulocytes: 0.01 10*3/uL (ref 0.00–0.07)
Basophils Absolute: 0.1 10*3/uL (ref 0.0–0.1)
Basophils Relative: 1 %
Eosinophils Absolute: 0.1 10*3/uL (ref 0.0–0.5)
Eosinophils Relative: 2 %
HCT: 34.3 % — ABNORMAL LOW (ref 36.0–46.0)
Hemoglobin: 10.2 g/dL — ABNORMAL LOW (ref 12.0–15.0)
Immature Granulocytes: 0 %
Lymphocytes Relative: 45 %
Lymphs Abs: 3.4 10*3/uL (ref 0.7–4.0)
MCH: 24.6 pg — ABNORMAL LOW (ref 26.0–34.0)
MCHC: 29.7 g/dL — ABNORMAL LOW (ref 30.0–36.0)
MCV: 82.9 fL (ref 80.0–100.0)
Monocytes Absolute: 0.5 10*3/uL (ref 0.1–1.0)
Monocytes Relative: 6 %
Neutro Abs: 3.4 10*3/uL (ref 1.7–7.7)
Neutrophils Relative %: 46 %
Platelets: 311 10*3/uL (ref 150–400)
RBC: 4.14 MIL/uL (ref 3.87–5.11)
RDW: 15.8 % — ABNORMAL HIGH (ref 11.5–15.5)
WBC: 7.5 10*3/uL (ref 4.0–10.5)
nRBC: 0 % (ref 0.0–0.2)

## 2018-07-11 LAB — COMPREHENSIVE METABOLIC PANEL
ALT: 23 U/L (ref 0–44)
AST: 27 U/L (ref 15–41)
Albumin: 3.8 g/dL (ref 3.5–5.0)
Alkaline Phosphatase: 71 U/L (ref 38–126)
Anion gap: 7 (ref 5–15)
BUN: 6 mg/dL (ref 6–20)
CO2: 24 mmol/L (ref 22–32)
Calcium: 8.7 mg/dL — ABNORMAL LOW (ref 8.9–10.3)
Chloride: 106 mmol/L (ref 98–111)
Creatinine, Ser: 0.75 mg/dL (ref 0.44–1.00)
GFR calc Af Amer: >60 mL/min (ref >60)
GFR calc non Af Amer: >60 mL/min (ref >60)
Glucose, Bld: 94 mg/dL (ref 70–99)
Potassium: 4 mmol/L (ref 3.5–5.1)
Sodium: 137 mmol/L (ref 135–145)
Total Bilirubin: 0.6 mg/dL (ref 0.3–1.2)
Total Protein: 7.4 g/dL (ref 6.5–8.1)

## 2018-07-11 MED ORDER — CLINDAMYCIN HCL 300 MG PO CAPS: 300.0000 mg | ORAL_CAPSULE | Freq: Three times a day (TID) | ORAL | 0 refills | Status: AC

## 2018-07-11 MED ORDER — CLINDAMYCIN HCL 150 MG PO CAPS
300.0000 mg | ORAL_CAPSULE | Freq: Once | ORAL | Status: AC
  Administered 2018-07-11: 300 mg via ORAL
  Filled 2018-07-11: qty 2

## 2018-07-11 NOTE — ED Provider Notes (Signed)
[facility] Emergency Department Provider Note ____________________________________________  Time seen: [id number]  I have reviewed the triage vital signs and the nursing notes.  HISTORY  Chief Complaint  Wound Infection  HPI Sonya Finley is a [age] y.o. female sent to the ED accompanied by her mother, for evaluation of tenderness and drainage to the right nipple.  Patient admits to having bilateral nipple rings about 6 5 years prior.  She reports having in the nipple rings in place for a total of about 5 to 6 years.  She describes over the last 2 days she is had increasing irritation and swelling to the right nipple in particular.  She noted today 2 distinct pustules on either side of the nipple.  She is also noted some spontaneous purulent drainage.  She denies any fevers, chills, or sweats.  She is not breast-feeding or lactating at this time.  Past Medical History:  Diagnosis Date  . Frequent headaches   . GERD (gastroesophageal reflux disease)   . H/O drug abuse (HCC) 2015   heroine, [facility]  . History of migraine more as teenager  . History of pyelonephritis 2013  . History of UTI    last 2015  . Hx of viral meningitis ~1995/96   hospitalized for 1 wk  . Seasonal allergies     Patient Active Problem List   Diagnosis Date Noted  . Onychomycosis 03/02/2015  . Thickened nails 03/02/2015  . GERD (gastroesophageal reflux disease) 03/02/2015  . Opiate dependence (HCC) 03/02/2015  . Bunion of great toe of right foot 03/02/2015  . Smoker 03/02/2015  . H/O drug abuse [facility](HCC)     Past Surgical History:  Procedure Laterality Date  . TONSILLECTOMY  ~1610/96~1994/95    Prior to Admission medications   Medication Sig Start Date End Date Taking? Authorizing Provider  clindamycin (CLEOCIN) 300 MG capsule Take 1 capsule (300 mg total) by mouth 3 (three) times daily for 10 days. 07/11/18 07/21/18  Livio Ledwith, Charlesetta IvoryJenise V Bacon, PA-C  METHADONE HCL  PO Take 120 mg by mouth daily.    [provider]  omeprazole (PRILOSEC) 40 MG capsule Take 1 capsule (40 mg total) by mouth daily. 03/02/15   Eustaquio BoydenGutierrez, Javier, MD    Allergies Patient has no known allergies.  Family History  Problem Relation Age of Onset  . Crohn's disease Mother   . Crohn's disease Maternal Aunt   . CAD Maternal Grandmother   . Alcohol abuse Father   . Drug abuse Father   . Cancer Neg Hx   . Diabetes Neg Hx     Social History Social History   Tobacco Use  . Smoking status: Current Every Day Smoker    Packs/day: 1.00    Types: Cigarettes    Start date: 06/05/2002  . Smokeless tobacco: Never Used  Substance Use Topics  . Alcohol use: No    Alcohol/week: 0.0 standard drinks  . Drug use: No    Comment: h/o heroine use    Review of Systems  Constitutional: Negative for fever. Cardiovascular: Negative for chest pain. Respiratory: Negative for shortness of breath. Musculoskeletal: Negative for back pain. Skin: Negative for rash. Right nipple infection as above Neurological: Negative for headaches, focal weakness or numbness. ____________________________________________  PHYSICAL EXAM:  VITAL SIGNS: ED Triage Vitals  Enc Vitals Group     BP 07/11/18 1646 127/75     Pulse Rate 07/11/18 1646 91     Resp 07/11/18 1646 18  Temp 07/11/18 1646 98.1 F (36.7 C)     Temp src --      SpO2 07/11/18 1646 100 %     Weight 07/11/18 1647 117 lb (53.1 kg)     Height 07/11/18 1647 4\' 11"  (1.499 m)     Head Circumference --      Peak Flow --      Pain Score 07/11/18 1646 0     Pain Loc --      Pain Edu? --      Excl. in GC? --    Constitutional: Alert and oriented. Well appearing and in no distress. Head: Normocephalic and atraumatic. Eyes: Conjunctivae are normal. Normal extraocular movements Hematological/Lymphatic/Immunological: No clavicular lymphadenopathy. Cardiovascular: Normal rate, regular rhythm. Normal distal pulses. Respiratory:  Normal respiratory effort. No wheezes/rales/rhonchi. Neurologic:  Normal gait without ataxia. Normal speech and language. No gross focal neurologic deficits are appreciated. Skin:  Skin is warm, dry and intact. No rash noted.  Right nipple with some local erythema noted.  Patient with 2-3 distinct pustules over the nipple.  There is no purulent or bloody drainage appreciated.  There is no induration or erythema of the areola.  ____________________________________________   LABS (pertinent positives/negatives)  Labs Reviewed  COMPREHENSIVE METABOLIC PANEL - Abnormal; Notable for the following components:      Result Value   Calcium 8.7 (*)    All other components within normal limits  CBC WITH DIFFERENTIAL/PLATELET - Abnormal; Notable for the following components:   Hemoglobin 10.2 (*)    HCT 34.3 (*)    MCH 24.6 (*)    MCHC 29.7 (*)    RDW 15.8 (*)    All other components within normal limits  ____________________________________________  PROCEDURES  Procedures Clindamycin 300 mg PO ____________________________________________  INITIAL IMPRESSION / ASSESSMENT AND PLAN / ED COURSE  Patient with ED evaluation of a nipple infection on the right.  Patient be treated empirically with clindamycin.  His initial doses provided in the ED.  She is encouraged to keep the nipple clean, dry, and covered.  She should apply warm compresses to promote healing.  Return precautions have been reviewed with the patient and her mother. ____________________________________________  FINAL CLINICAL IMPRESSION(S) / ED DIAGNOSES  Final diagnoses:  Cellulitis of trunk, unspecified site of trunk  Nipple infection      Lissa Hoard, PA-C 07/11/18 1957    Rockne Menghini, MD 07/11/18 2324

## 2018-07-11 NOTE — Discharge Instructions (Addendum)
Take the antibiotic until all pills are gone. Keep the wound clean, dry, and covered. Apply warm compresses to promote healing. Follow-up with [facility] or return for worsening symptoms.

## 2018-07-11 NOTE — ED Triage Notes (Signed)
Pt states rt nipple infection - used to hava a nipple ring, sensitivity started on Monday. Last night noticed puss coming from the old nipple ring site.

## 2018-07-31 ENCOUNTER — Other Ambulatory Visit: Payer: Self-pay

## 2018-07-31 ENCOUNTER — Emergency Department
Admission: EM | Admit: 2018-07-31 | Discharge: 2018-07-31 | Disposition: A | Discharge status: Home / Self Care | Payer: Self-pay | Attending: Student in an Organized Health Care Education/Training Program | Admitting: Student in an Organized Health Care Education/Training Program

## 2018-07-31 DIAGNOSIS — X58XXXA Exposure to other specified factors, initial encounter: Secondary | ICD-10-CM | POA: Insufficient documentation

## 2018-07-31 DIAGNOSIS — Y999 Unspecified external cause status: Secondary | ICD-10-CM | POA: Insufficient documentation

## 2018-07-31 DIAGNOSIS — S025XXA Fracture of tooth (traumatic), initial encounter for closed fracture: Secondary | ICD-10-CM | POA: Insufficient documentation

## 2018-07-31 DIAGNOSIS — F17210 Nicotine dependence, cigarettes, uncomplicated: Secondary | ICD-10-CM | POA: Insufficient documentation

## 2018-07-31 DIAGNOSIS — Y939 Activity, unspecified: Secondary | ICD-10-CM | POA: Insufficient documentation

## 2018-07-31 DIAGNOSIS — Y929 Unspecified place or not applicable: Secondary | ICD-10-CM | POA: Insufficient documentation

## 2018-07-31 MED ORDER — OXYCODONE-ACETAMINOPHEN 5-325 MG PO TABS
1.0000 | ORAL_TABLET | Freq: Once | ORAL | Status: AC
  Administered 2018-07-31: 1 via ORAL
  Filled 2018-07-31: qty 1

## 2018-07-31 MED ORDER — LIDOCAINE VISCOUS HCL 2 % MT SOLN
15.0000 mL | Freq: Once | OROMUCOSAL | Status: AC
  Administered 2018-07-31: 15 mL via OROMUCOSAL
  Filled 2018-07-31: qty 15

## 2018-07-31 MED ORDER — AMOXICILLIN 500 MG PO CAPS: 500.0000 mg | ORAL_CAPSULE | Freq: Three times a day (TID) | ORAL | 0 refills | Status: DC

## 2018-07-31 MED ORDER — IBUPROFEN 600 MG PO TABS: 600.0000 mg | ORAL_TABLET | Freq: Three times a day (TID) | ORAL | 0 refills | Status: DC | PRN

## 2018-07-31 MED ORDER — TRAMADOL HCL 50 MG PO TABS: 50.0000 mg | ORAL_TABLET | Freq: Four times a day (QID) | ORAL | 0 refills | Status: DC | PRN

## 2018-07-31 NOTE — ED Provider Notes (Signed)
[facility] Emergency Department Provider Note   ____________________________________________   First MD Initiated Contact with Patient 07/31/18 1403     (approximate)  I have reviewed the triage vital signs and the nursing notes.   HISTORY  Chief Complaint Dental Pain    HPI Sonya Finley is a 33 y.o. female patient presents with right lower dental pain that began today.  Patient has a history of devitalized teeth.  Patient denies fever.  Patient rates the pain as a 9/10.  Patient described the pain is "achy".  No palliative measure for complaint.    Past Medical History:  Diagnosis Date  . Frequent headaches   . GERD (gastroesophageal reflux disease)   . 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 History of migraine more as teenager  . History of pyelonephritis 2013  . History of UTI    last 2015  . Hx of viral meningitis ~1995/96   hospitalized for 1 wk  . Seasonal allergies     Patient Active Problem List   Diagnosis Date Noted  . Onychomycosis 03/02/2015  . Thickened nails 03/02/2015  . GERD (gastroesophageal reflux disease) 03/02/2015  . Opiate dependence (HCC) 03/02/2015  . Bunion of great toe of right foot 03/02/2015  . Smoker 03/02/2015  . H/O drug abuse [facility])     Past Surgical History:  Procedure Laterality Date  . TONSILLECTOMY  ~8299/37    Prior to Admission medications   Medication Sig Start Date End Date Taking? Authorizing Provider  amoxicillin (AMOXIL) 500 MG capsule Take 1 capsule (500 mg total) by mouth 3 (three) times daily. 07/31/18   Joni Reining, PA-C  ibuprofen (ADVIL,MOTRIN) 600 MG tablet Take 1 tablet (600 mg total) by mouth every 8 (eight) hours as needed. 07/31/18   Joni Reining, PA-C  METHADONE HCL PO Take 113 mg by mouth daily.     [provider]  traMADol (ULTRAM) 50 MG tablet Take 1 tablet (50 mg total) by mouth every 6 (six) hours as needed.  07/31/18 07/31/19  Joni Reining, PA-C    Allergies Patient has no known allergies.  Family History  Problem Relation Age of Onset  . Crohn's disease Mother   . Crohn's disease Maternal Aunt   . CAD Maternal Grandmother   . Alcohol abuse Father   . Drug abuse Father   . Cancer Neg Hx   . Diabetes Neg Hx     Social History Social History   Tobacco Use  . Smoking status: Current Every Day Smoker    Packs/day: 1.00    Types: Cigarettes    Start date: 06/05/2002  . Smokeless tobacco: Never Used  Substance Use Topics  . Alcohol use: No    Alcohol/week: 0.0 standard drinks  . Drug use: No    Comment: h/o heroine use    Review of Systems  Constitutional: No fever/chills Eyes: No visual changes. ENT: No sore throat. Cardiovascular: Denies chest pain. Respiratory: Denies shortness of breath. Gastrointestinal: No abdominal pain.  No nausea, no vomiting.  No diarrhea.  No constipation. Genitourinary: Negative for dysuria. Musculoskeletal: Negative for back pain. Skin: Negative for rash. Neurological: Negative for headaches, focal weakness or numbness. Psychiatric:  History of drug abuse.  ____________________________________________   PHYSICAL EXAM:  VITAL SIGNS: ED Triage Vitals [07/31/18 1357]  Enc Vitals Group     BP (!) 130/91     Pulse Rate 87  Resp 16     Temp 98 F (36.7 C)     Temp Source Oral     SpO2 98 %     Weight 123 lb (55.8 kg)     Height 4\' 11"  (1.499 m)     Head Circumference      Peak Flow      Pain Score 9     Pain Loc      Pain Edu?      Excl. in GC?     Constitutional: Alert and oriented. Well appearing and in no acute distress. Mouth/Throat: Mucous membranes are moist.  Oropharynx non-erythematous.  Fractured tooth #29. Neck: No stridor. Hematological/Lymphatic/Immunilogical: No cervical lymphadenopathy. Cardiovascular: Normal rate, regular rhythm. Grossly normal heart sounds.  Good peripheral circulation. Respiratory: Normal  respiratory effort.  No retractions. Lungs CTAB. Skin:  Skin is warm, dry and intact. No rash noted. Psychiatric: Mood and affect are normal. Speech and behavior are normal.  ____________________________________________   LABS (all labs ordered are listed, but only abnormal results are displayed)  Labs Reviewed - No data to display ____________________________________________  EKG   ____________________________________________  RADIOLOGY  ED MD interpretation:    Official radiology report(s): No results found.  ____________________________________________   PROCEDURES  Procedure(s) performed (including Critical Care):  Procedures   ____________________________________________   INITIAL IMPRESSION / ASSESSMENT AND PLAN / ED COURSE  As part of my medical decision making, I reviewed the following data within the electronic MEDICAL RECORD NUMBER     Patient presents with dental pain secondary to a fractured tooth.  Patient given discharge care instructions advised to follow-up from list of dental clinics provided at discharge.  Take medication as directed.      ____________________________________________   FINAL CLINICAL IMPRESSION(S) / ED DIAGNOSES  Final diagnoses:  Closed fracture of tooth, initial encounter     ED Discharge Orders         Ordered    amoxicillin (AMOXIL) 500 MG capsule  3 times daily     07/31/18 1412    traMADol (ULTRAM) 50 MG tablet  Every 6 hours PRN     07/31/18 1412    ibuprofen (ADVIL,MOTRIN) 600 MG tablet  Every 8 hours PRN     07/31/18 1412           Note:  This document was prepared using Dragon voice recognition software and may include unintentional dictation errors.    Joni Reining, PA-C 07/31/18 1418    Willy Eddy, MD 07/31/18 1455

## 2018-07-31 NOTE — Discharge Instructions (Addendum)
Take medication as directed and follow-up with from list of dental clinics provided with a discharge.

## 2018-07-31 NOTE — ED Triage Notes (Signed)
Right lower dental pain that began today. Pt alert and oriented X4, active, cooperative, pt in NAD. RR even and unlabored, color WNL.

## 2018-07-31 NOTE — ED Notes (Signed)
See triage note  Presents with dental pain states she woke up with a slight twinge   But pain became increased with eating  Pain to right lower gumline  Tooth is broken

## 2018-08-26 ENCOUNTER — Other Ambulatory Visit: Payer: Self-pay | Admitting: Physician Assistant

## 2018-08-26 ENCOUNTER — Other Ambulatory Visit: Payer: Self-pay

## 2018-08-26 ENCOUNTER — Emergency Department
Admission: EM | Admit: 2018-08-26 | Discharge: 2018-08-26 | Disposition: A | Discharge status: Home / Self Care | Payer: Self-pay | Attending: Emergency Medicine | Admitting: Emergency Medicine

## 2018-08-26 DIAGNOSIS — K047 Periapical abscess without sinus: Secondary | ICD-10-CM | POA: Insufficient documentation

## 2018-08-26 DIAGNOSIS — Z79899 Other long term (current) drug therapy: Secondary | ICD-10-CM | POA: Insufficient documentation

## 2018-08-26 DIAGNOSIS — F17210 Nicotine dependence, cigarettes, uncomplicated: Secondary | ICD-10-CM | POA: Insufficient documentation

## 2018-08-26 MED ORDER — IBUPROFEN 600 MG PO TABS
600.0000 mg | ORAL_TABLET | Freq: Once | ORAL | Status: AC
  Administered 2018-08-26: 600 mg via ORAL
  Filled 2018-08-26: qty 1

## 2018-08-26 MED ORDER — IBUPROFEN 600 MG PO TABS: 600.0000 mg | ORAL_TABLET | Freq: Three times a day (TID) | ORAL | 0 refills | Status: DC | PRN

## 2018-08-26 MED ORDER — AMOXICILLIN 500 MG PO CAPS: 500.0000 mg | ORAL_CAPSULE | Freq: Three times a day (TID) | ORAL | 0 refills | Status: DC

## 2018-08-26 MED ORDER — LIDOCAINE VISCOUS HCL 2 % MT SOLN
15.0000 mL | Freq: Once | OROMUCOSAL | Status: AC
  Administered 2018-08-26: 15 mL via OROMUCOSAL
  Filled 2018-08-26: qty 15

## 2018-08-26 MED ORDER — TRAMADOL HCL 50 MG PO TABS: 50.0000 mg | ORAL_TABLET | Freq: Four times a day (QID) | ORAL | 0 refills | Status: DC | PRN

## 2018-08-26 NOTE — Discharge Instructions (Addendum)
Follow-up from list of dental clinics provided in your discharge care instructions.

## 2018-08-26 NOTE — ED Triage Notes (Signed)
Pt presents via POV c/o left sided mouth pain since Saturday. Reports tooth abscess.

## 2018-08-26 NOTE — ED Notes (Signed)
Patient complains of soreness and swelling to left gum. Soreness since Saturday, swelling since today.

## 2018-08-26 NOTE — ED Provider Notes (Signed)
[facility] Emergency Department Provider Note   ____________________________________________   First MD Initiated Contact with Patient 08/26/18 2001     (approximate)  I have reviewed the triage vital signs and the nursing notes.   HISTORY  Chief Complaint No chief complaint on file.    HPI Sonya Finley is a 33 y.o. female patient presents with dental pain.  This is a chronic problem for this patient secondary to poor dental hygiene.  Patient state mild edema to the left lateral molar area.  Patient denies fever with this complaint.  Patient denies dysphagia.  Patient is only 1 molar left lower jaw.  Molar is broken and decayed.  Mild gingival edema.   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, on Methadone by [facility]   History of migraine more as teenager   History of pyelonephritis 2013   History of UTI    last 2015   Hx of viral meningitis ~1995/96   hospitalized for 1 wk   Seasonal allergies     Patient Active Problem List   Diagnosis Date Noted   Onychomycosis 03/02/2015   Thickened nails 03/02/2015   GERD (gastroesophageal reflux disease) 03/02/2015   Opiate dependence (HCC) 03/02/2015   Bunion of great toe of right foot 03/02/2015   Smoker 03/02/2015   H/O drug abuse [facility])     Past Surgical History:  Procedure Laterality Date   TONSILLECTOMY  ~1994/95    Prior to Admission medications   Medication Sig Start Date End Date Taking? Authorizing Provider  amoxicillin (AMOXIL) 500 MG capsule Take 1 capsule (500 mg total) by mouth 3 (three) times daily. 08/26/18   Joni Reining, PA-C  ibuprofen (ADVIL,MOTRIN) 600 MG tablet Take 1 tablet (600 mg total) by mouth every 8 (eight) hours as needed. 08/26/18   Joni Reining, PA-C  METHADONE HCL PO Take 113 mg by mouth daily.     [provider]    traMADol (ULTRAM) 50 MG tablet Take 1 tablet (50 mg total) by mouth every 6 (six) hours as needed. 08/26/18 08/26/19  Joni Reining, PA-C    Allergies Patient has no known allergies.  Family History  Problem Relation Age of Onset   Crohn's disease Mother    Crohn's disease Maternal Aunt    CAD Maternal Grandmother    Alcohol abuse Father    Drug abuse Father    Cancer Neg Hx    Diabetes Neg Hx     Social History Social History   Tobacco Use   Smoking status: Current Every Day Smoker    Packs/day: 1.00    Types: Cigarettes    Start date: 06/05/2002   Smokeless tobacco: Never Used  Substance Use Topics   Alcohol use: No    Alcohol/week: 0.0 standard drinks   Drug use: No    Comment: h/o heroine use    Review of Systems Constitutional: No fever/chills Eyes: No visual changes. ENT: No sore throat.  Dental pain. Cardiovascular: Denies chest pain. Respiratory: Denies shortness of breath. Gastrointestinal: No abdominal pain.  No nausea, no vomiting.  No diarrhea.  No constipation. Genitourinary: Negative for dysuria. Musculoskeletal: Negative for back pain. Skin: Negative for rash. Neurological: Negative for headaches, focal weakness or numbness.   ____________________________________________   PHYSICAL EXAM:  VITAL SIGNS: ED Triage Vitals  Enc Vitals Group  BP 08/26/18 1957 (!) 136/91     Pulse Rate 08/26/18 1957 91     Resp 08/26/18 1957 14     Temp 08/26/18 1957 98.3 F (36.8 C)     Temp Source 08/26/18 1957 Oral     SpO2 08/26/18 1957 99 %     Weight 08/26/18 1955 123 lb (55.8 kg)     Height --      Head Circumference --      Peak Flow --      Pain Score 08/26/18 1955 7     Pain Loc --      Pain Edu? --      Excl. in GC? --    Constitutional: Alert and oriented. Well appearing and in no acute distress. Mouth/Throat: Mucous membranes are moist.  Oropharynx non-erythematous.  Multiple broken and decayed molars in upper and lower jaw.   Mild gingival edema tooth #19 and 20. Neck: No stridor.   Hematological/Lymphatic/Immunilogical: No cervical lymphadenopathy. Cardiovascular: Normal rate, regular rhythm. Grossly normal heart sounds.  Good peripheral circulation. Respiratory: Normal respiratory effort.  No retractions. Lungs CTAB. Skin:  Skin is warm, dry and intact. No rash noted.  ____________________________________________   LABS (all labs ordered are listed, but only abnormal results are displayed)  Labs Reviewed - No data to display ____________________________________________  EKG   ____________________________________________  RADIOLOGY  ED MD interpretation:    Official radiology report(s): No results found.  ____________________________________________   PROCEDURES  Procedure(s) performed (including Critical Care):  Procedures   ____________________________________________   INITIAL IMPRESSION / ASSESSMENT AND PLAN / ED COURSE  As part of my medical decision making, I reviewed the following data within the electronic MEDICAL RECORD NUMBER         Dental pain secondary to small abscess tooth #20.  Patient given discharge care instruction advised take medication as directed.  Patient advised follow-up for list of dental clinics provided.      ____________________________________________   FINAL CLINICAL IMPRESSION(S) / ED DIAGNOSES  Final diagnoses:  Dental abscess     ED Discharge Orders         Ordered    amoxicillin (AMOXIL) 500 MG capsule  3 times daily     08/26/18 2010    ibuprofen (ADVIL,MOTRIN) 600 MG tablet  Every 8 hours PRN     08/26/18 2010    traMADol (ULTRAM) 50 MG tablet  Every 6 hours PRN     08/26/18 2010           Note:  This document was prepared using Dragon voice recognition software and may include unintentional dictation errors.    Joni Reining, PA-C 08/26/18 2019    Sharyn Creamer, MD 08/26/18 2219

## 2019-01-01 ENCOUNTER — Other Ambulatory Visit: Payer: Self-pay

## 2019-01-01 ENCOUNTER — Encounter: Payer: Self-pay | Admitting: Emergency Medicine

## 2019-01-01 ENCOUNTER — Emergency Department
Admission: EM | Admit: 2019-01-01 | Discharge: 2019-01-01 | Disposition: A | Discharge status: Home / Self Care | Payer: Self-pay | Attending: Emergency Medicine | Admitting: Emergency Medicine

## 2019-01-01 DIAGNOSIS — K0889 Other specified disorders of teeth and supporting structures: Secondary | ICD-10-CM | POA: Insufficient documentation

## 2019-01-01 DIAGNOSIS — F17210 Nicotine dependence, cigarettes, uncomplicated: Secondary | ICD-10-CM | POA: Insufficient documentation

## 2019-01-01 DIAGNOSIS — Z79899 Other long term (current) drug therapy: Secondary | ICD-10-CM | POA: Insufficient documentation

## 2019-01-01 MED ORDER — AMOXICILLIN 875 MG PO TABS: 875.0000 mg | ORAL_TABLET | Freq: Two times a day (BID) | ORAL | 0 refills | Status: AC

## 2019-01-01 NOTE — ED Triage Notes (Signed)
Pt presents to ED via POV with c/o dental pain x several days. Pt c/o L sided dental pain.

## 2019-01-01 NOTE — Discharge Instructions (Signed)
OPTIONS FOR DENTAL FOLLOW UP CARE ° °[place] Department of Health and Human Services - Local Safety Net Dental Clinics °http://www.ncdhhs.gov/dph/oralhealth/services/safetynetclinics.htm °  °[facility] (336-562-3123) ° °[facility] [place] ([phone]) ° °[facility] ([phone] ext [phone]) ° °[facility] ([phone]) ° °[facility] ([phone]) °This clinic caters to the indigent population and is on a lottery system. °Location: °[facility], [address], [place] °Clinic Hours: °Wednesdays from 6pm - 9pm, patients seen by a lottery system. °For dates, call or go to [email or web address].[email or web address]/[facility]/patients/Dental-[facility] °Services: °Cleanings, fillings and simple extractions. °Payment Options: °DENTAL WORK IS FREE OF CHARGE. Bring proof of income or support. °Best way to get seen: °Arrive at 5:15 pm - this is a lottery, NOT first come/first serve, so arriving earlier will not increase your chances of being seen. °  °  °[facility] °[phone] °Select option 1 for emergencies °  °Location: °[facility], [address], [place] °Clinic Hours: °No walk-ins accepted - call the day before to schedule an appointment. °Check in times are 9:30 am and 1:30 pm. °Services: °Simple extractions, temporary fillings, pulpectomy/pulp debridement, uncomplicated abscess drainage. °Payment Options: °PAYMENT IS DUE AT THE TIME OF SERVICE.  Fee is usually $100-200, additional surgical procedures (e.g. abscess drainage) may be extra. °Cash, checks, Visa/MasterCard accepted.  Can file Medicaid if patient is covered for dental - patient should call case worker to check. °No discount for [facility] patients. °Best way to get seen: °MUST call the day before and get onto the schedule. Can usually be seen the next 1-2 days. No walk-ins accepted. °  °  °[facility] °[phone] °   °Location: °[facility], [address], [place] °Clinic Hours: °M, W, Th, F 8am or 1:30pm, Tues 9a or 1:30 - first come/first served. °Services: °Simple extractions, temporary fillings, uncomplicated abscess drainage.  You do not need to be an [place] resident. °Payment Options: °PAYMENT IS DUE AT THE TIME OF SERVICE. °Dental insurance, otherwise sliding scale - bring proof of income or support. °Depending on income and treatment needed, cost is usually $50-200. °Best way to get seen: °Arrive early as it is first come/first served. °  °  °[facility] °[phone] °  °Location: °[address] °Clinic Hours: °Mon-Thu 8a-5p °Services: °Most basic dental services including extractions and fillings. °Payment Options: °PAYMENT IS DUE AT THE TIME OF SERVICE. °Sliding scale, up to 50% off - bring proof if income or support. °Medicaid with dental option accepted. °Best way to get seen: °Call to schedule an appointment, can usually be seen within 2 weeks OR they will try to see walk-ins - show up at 8a or 2p (you may have to wait). °  °  °[facility] °[phone] °[place] RESIDENTS ONLY °  °Location: °[facility], [address], [place], [place] [address] °Clinic Hours: By appointment only. °Monday - Thursday 8am-5pm, Friday 8am-12pm °Services: Cleanings, fillings, extractions. °Payment Options: °PAYMENT IS DUE AT THE TIME OF SERVICE. °Cash, Visa or MasterCard. Sliding scale - $30 minimum per service. °Best way to get seen: °Come in to office, complete packet and make an appointment - need proof of income °or support monies for each household member and proof of [place] residence. °Usually takes about a month to get in. °  °  °[facility] °[phone] °  °Location: °[address]   [place] °Clinic Hours: Walk-in Urgent Care Dental Services are offered Monday-Friday  mornings only. °The numbers of emergencies accepted daily is limited to the number of °providers available. °Maximum 15 - Mondays, Wednesdays & Thursdays °Maximum 10 - Tuesdays & Fridays °Services: °You do not need to be a [place] resident to be seen for a dental emergency. °Emergencies are defined as pain, swelling, abnormal bleeding, or dental trauma. Walkins will receive x-rays if needed. °NOTE: Dental cleaning is not an emergency. °Payment Options: °PAYMENT IS DUE AT THE TIME OF SERVICE. °Minimum co-pay is $40.00 for uninsured patients. °Minimum co-pay is $3.00 for Medicaid with dental coverage. °Dental Insurance is accepted and must be presented at time of visit. °Medicare does not cover dental. °Forms of payment: Cash, credit card, checks. °Best way to get seen: °If not previously registered with the clinic, walk-in dental registration begins at 7:15 am and is on a first come/first serve basis. °If previously registered with the clinic, call to make an appointment. °  °  °[facility] °[phone] °[place] RESIDENTS ONLY °  °Location: °[address], [place], [place] °Clinic Hours: °Mon-Thu 10a-2p °Services: Extractions only! °Payment Options: °FREE (donations accepted) - bring proof of income or support °Best way to get seen: °Call and schedule an appointment OR come at 8am on the 1st Monday of every month (except for holidays) when it is first come/first served. °  °  °[facility] °[phone] °  °Location: °[address],  °Clinic Hours: °Friday mornings °Services, Payment Options, Best way to get seen: °Call for info °

## 2019-01-01 NOTE — ED Provider Notes (Signed)
[facility] Emergency Department Provider Note  ____________________________________________  Time seen: Approximately 6:40 PM  I have reviewed the triage vital signs and the nursing notes.   HISTORY  Chief Complaint Dental Pain    HPI Joyceann Kruser is a 33 y.o. female presents to the emergency department with swelling of the right lower jaw for the past 2 to 3 days.  Patient has a history of dental caries on the affected side and reports that she is going to contact the dentist tomorrow.  She denies fever and chills at home.  She has had pain with chewing on the affected side.  No pain underneath the tongue or difficulty swallowing.  She is able to speak in complete sentences.  Patient states that her pain is been well controlled with Tylenol and ibuprofen and she is not requesting additional pain medication.  No other alleviating measures have been attempted.        Past Medical History:  Diagnosis Date  . Frequent headaches   . GERD (gastroesophageal reflux disease)   . H/O drug abuse ([place]) 2015   heroine, on Methadone by [facility]  . History of migraine more as teenager  . History of pyelonephritis 2013  . History of UTI    last 2015  . Hx of viral meningitis ~1995/96   hospitalized for 1 wk  . Seasonal allergies     Patient Active Problem List   Diagnosis Date Noted  . Onychomycosis 03/02/2015  . Thickened nails 03/02/2015  . GERD (gastroesophageal reflux disease) 03/02/2015  . Opiate dependence ([place]) 03/02/2015  . Bunion of great toe of right foot 03/02/2015  . Smoker 03/02/2015  . H/O drug abuse [facility])     Past Surgical History:  Procedure Laterality Date  . TONSILLECTOMY  ~0814/48    Prior to Admission medications   Medication Sig Start Date End Date Taking? Authorizing Provider  amoxicillin (AMOXIL) 875 MG tablet Take 1 tablet (875 mg total) by mouth 2 (two) times daily for 10 days. 01/01/19 01/11/19  Lannie Fields, PA-C  ibuprofen (ADVIL,MOTRIN) 600 MG tablet Take 1 tablet (600 mg total) by mouth every 8 (eight) hours as needed. 08/26/18   Sable Feil, PA-C  METHADONE HCL PO Take 113 mg by mouth daily.     [provider]  traMADol (ULTRAM) 50 MG tablet Take 1 tablet (50 mg total) by mouth every 6 (six) hours as needed. 08/26/18 08/26/19  Sable Feil, PA-C    Allergies Patient has no known allergies.  Family History  Problem Relation Age of Onset  . Crohn's disease Mother   . Crohn's disease Maternal Aunt   . CAD Maternal Grandmother   . Alcohol abuse Father   . Drug abuse Father   . Cancer Neg Hx   . Diabetes Neg Hx     Social History Social History   Tobacco Use  . Smoking status: Current Every Day Smoker    Packs/day: 1.00    Types: Cigarettes    Start date: 06/05/2002  . Smokeless tobacco: Never Used  Substance Use Topics  . Alcohol use: No    Alcohol/week: 0.0 standard drinks  . Drug use: No    Comment: h/o heroine use     Review of Systems  Constitutional: No fever/chills Eyes: No visual changes. No discharge ENT: Patient has right lower jaw pain.  Cardiovascular: no chest pain. Respiratory: no cough. No SOB. Gastrointestinal: No abdominal pain.  No nausea, no vomiting.  No diarrhea.  No constipation. Genitourinary: Negative for dysuria. No hematuria Musculoskeletal: Negative for musculoskeletal pain. Skin: Negative for rash, abrasions, lacerations, ecchymosis. Neurological: Negative for headaches, focal weakness or numbness.   ____________________________________________   PHYSICAL EXAM:  VITAL SIGNS: ED Triage Vitals  Enc Vitals Group     BP 01/01/19 1641 132/76     Pulse Rate 01/01/19 1641 74     Resp --      Temp 01/01/19 1641 98 F (36.7 C)     Temp Source 01/01/19 1641 Oral     SpO2 01/01/19 1641 100 %     Weight 01/01/19 1639 125 lb (56.7 kg)     Height 01/01/19 1639 4\' 11"  (1.499 m)     Head Circumference --      Peak Flow --       Pain Score 01/01/19 1638 5     Pain Loc --      Pain Edu? --      Excl. in GC? --      Constitutional: Alert and oriented. Well appearing and in no acute distress. Eyes: Conjunctivae are normal. PERRL. EOMI. Head: Atraumatic. ENT:      Nose: No congestion/rhinnorhea.      Mouth/Throat: Mucous membranes are moist.  Patient has swelling of right lower jaw.  Multiple dental caries visualized on the affected side.  No pain to palpation underneath the tongue.  Neck: No stridor.  No cervical spine tenderness to palpation. Hematological/Lymphatic/Immunilogical: No cervical lymphadenopathy. Cardiovascular: Normal rate, regular rhythm. Normal S1 and S2.  Good peripheral circulation. Respiratory: Normal respiratory effort without tachypnea or retractions. Lungs CTAB. Good air entry to the bases with no decreased or absent breath sounds. Skin:  Skin is warm, dry and intact. No rash noted. Psychiatric: Mood and affect are normal. Speech and behavior are normal. Patient exhibits appropriate insight and judgement.   ____________________________________________   LABS (all labs ordered are listed, but only abnormal results are displayed)  Labs Reviewed - No data to display ____________________________________________  EKG   ____________________________________________  RADIOLOGY   No results found.  ____________________________________________    PROCEDURES  Procedure(s) performed:    Procedures    Medications - No data to display   ____________________________________________   INITIAL IMPRESSION / ASSESSMENT AND PLAN / ED COURSE  Pertinent labs & imaging results that were available during my care of the patient were reviewed by me and considered in my medical decision making (see chart for details).  Review of the [place] CSRS was performed in accordance of the [facility] prior to dispensing any controlled drugs.         Assessment and plan Dental abscess 33 year old  female presents to the emergency department with swelling of the right lower jaw for the past several days.  Vital signs were stable at triage.  On physical exam, patient had edema of the right lower jaw.  She had no pain to palpation underneath the tongue or difficulty swallowing.  She was maintaining her own secretions.  Patient has multiple dental caries on the affected side.  Differential diagnosis included dental abscess, dental caries and dental pain  Patient was treated with amoxicillin.  She was advised to make an appointment with a local dentist as soon as possible.  All patient questions were answered.    ____________________________________________  FINAL CLINICAL IMPRESSION(S) / ED DIAGNOSES  Final diagnoses:  Pain, dental      NEW MEDICATIONS STARTED DURING THIS VISIT:  ED Discharge Orders  Ordered    amoxicillin (AMOXIL) 875 MG tablet  2 times daily     01/01/19 1838              This chart was dictated using voice recognition software/Dragon. Despite best efforts to proofread, errors can occur which can change the meaning. Any change was purely unintentional.    Orvil FeilWoods, Sameerah Nachtigal M, PA-C 01/01/19 1843    Concha SeFunke, Mary E, MD 01/03/19 [phone]

## 2019-04-22 ENCOUNTER — Other Ambulatory Visit: Payer: Self-pay

## 2019-04-22 ENCOUNTER — Emergency Department
Admission: EM | Admit: 2019-04-22 | Discharge: 2019-04-22 | Disposition: A | Discharge status: Home / Self Care | Payer: Self-pay | Attending: Emergency Medicine | Admitting: Emergency Medicine

## 2019-04-22 ENCOUNTER — Encounter: Payer: Self-pay | Admitting: Emergency Medicine

## 2019-04-22 DIAGNOSIS — K0889 Other specified disorders of teeth and supporting structures: Secondary | ICD-10-CM | POA: Insufficient documentation

## 2019-04-22 DIAGNOSIS — F17210 Nicotine dependence, cigarettes, uncomplicated: Secondary | ICD-10-CM | POA: Insufficient documentation

## 2019-04-22 DIAGNOSIS — Z79899 Other long term (current) drug therapy: Secondary | ICD-10-CM | POA: Insufficient documentation

## 2019-04-22 MED ORDER — AMOXICILLIN 500 MG PO CAPS: 500.0000 mg | ORAL_CAPSULE | Freq: Three times a day (TID) | ORAL | 0 refills | Status: DC

## 2019-04-22 MED ORDER — IBUPROFEN 600 MG PO TABS: 600.0000 mg | ORAL_TABLET | Freq: Three times a day (TID) | ORAL | 0 refills | Status: DC | PRN

## 2019-04-22 MED ORDER — TRAMADOL HCL 50 MG PO TABS: 50.0000 mg | ORAL_TABLET | Freq: Four times a day (QID) | ORAL | 0 refills | Status: DC | PRN

## 2019-04-22 NOTE — ED Provider Notes (Signed)
[facility] Emergency Department Provider Note   ____________________________________________   First MD Initiated Contact with Patient 04/22/19 1503     (approximate)  I have reviewed the triage vital signs and the nursing notes.   HISTORY  Chief Complaint Dental Pain    HPI Sonya Finley is a 33 y.o. female patient complain of right lower dental pain/falling started 3 days ago.  Patient has a history of devitalized teeth for over a year and has not follow-up with dentist as stated in previous visits.  Patient denies fever associated this complaint.  Patient rates her pain as a 5/10.  Patient described pain is "achy".  Patient states she receives moderate pain relief with over-the-counter Tylenol ibuprofen.      Past Medical History:  Diagnosis Date  . Frequent headaches   . GERD (gastroesophageal reflux disease)   . 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 History of migraine more as teenager  . History of pyelonephritis 2013  . History of UTI    last 2015  . Hx of viral meningitis ~1995/96   hospitalized for 1 wk  . Seasonal allergies     Patient Active Problem List   Diagnosis Date Noted  . Onychomycosis 03/02/2015  . Thickened nails 03/02/2015  . GERD (gastroesophageal reflux disease) 03/02/2015  . Opiate dependence ([place]) 03/02/2015  . Bunion of great toe of right foot 03/02/2015  . Smoker 03/02/2015  . H/O drug abuse [facility])     Past Surgical History:  Procedure Laterality Date  . TONSILLECTOMY  ~9211/94    Prior to Admission medications   Medication Sig Start Date End Date Taking? Authorizing Provider  amoxicillin (AMOXIL) 500 MG capsule Take 1 capsule (500 mg total) by mouth 3 (three) times daily. 04/22/19   Sable Feil, PA-C  ibuprofen (ADVIL) 600 MG tablet Take 1 tablet (600 mg total) by mouth every 8 (eight) hours as needed. 04/22/19   Sable Feil, PA-C  ibuprofen  (ADVIL,MOTRIN) 600 MG tablet Take 1 tablet (600 mg total) by mouth every 8 (eight) hours as needed. 08/26/18   Sable Feil, PA-C  METHADONE HCL PO Take 113 mg by mouth daily.     [provider]  traMADol (ULTRAM) 50 MG tablet Take 1 tablet (50 mg total) by mouth every 6 (six) hours as needed for moderate pain. 04/22/19   Sable Feil, PA-C    Allergies Patient has no known allergies.  Family History  Problem Relation Age of Onset  . Crohn's disease Mother   . Crohn's disease Maternal Aunt   . CAD Maternal Grandmother   . Alcohol abuse Father   . Drug abuse Father   . Cancer Neg Hx   . Diabetes Neg Hx     Social History Social History   Tobacco Use  . Smoking status: Current Every Day Smoker    Packs/day: 1.00    Types: Cigarettes    Start date: 06/05/2002  . Smokeless tobacco: Never Used  Substance Use Topics  . Alcohol use: No    Alcohol/week: 0.0 standard drinks  . Drug use: No    Comment: h/o heroine use    Review of Systems Constitutional: No fever/chills Eyes: No visual changes. ENT: No sore throat.  Dental pain. Cardiovascular: Denies chest pain. Respiratory: Denies shortness of breath. Gastrointestinal: No abdominal pain.  No nausea, no vomiting.  No diarrhea.  No constipation. Genitourinary: Negative for dysuria. Musculoskeletal:  Negative for back pain. Skin: Negative for rash. Neurological: Negative for headaches, focal weakness or numbness.   ____________________________________________   PHYSICAL EXAM:  VITAL SIGNS: ED Triage Vitals  Enc Vitals Group     BP 04/22/19 1403 (!) 118/92     Pulse Rate 04/22/19 1403 75     Resp 04/22/19 1403 16     Temp 04/22/19 1403 98 F (36.7 C)     Temp Source 04/22/19 1403 Oral     SpO2 04/22/19 1403 98 %     Weight 04/22/19 1401 120 lb (54.4 kg)     Height 04/22/19 1401 4\' 11"  (1.499 m)     Head Circumference --      Peak Flow --      Pain Score 04/22/19 1401 5     Pain Loc --      Pain  Edu? --      Excl. in GC? --     Constitutional: Alert and oriented. Well appearing and in no acute distress. Mouth/Throat: Mucous membranes are moist.  Oropharynx non-erythematous.  Multiple dental caries right lower molar area.  Mild gingival edema. Neck: No stridor.   Hematological/Lymphatic/Immunilogical: No cervical lymphadenopathy. Cardiovascular: Normal rate, regular rhythm. Grossly normal heart sounds.  Good peripheral circulation. Respiratory: Normal respiratory effort.  No retractions. Lungs CTAB. Skin:  Skin is warm, dry and intact. No rash noted. Psychiatric: Mood and affect are normal. Speech and behavior are normal.  ____________________________________________   LABS (all labs ordered are listed, but only abnormal results are displayed)  Labs Reviewed - No data to display ____________________________________________  EKG   ____________________________________________  RADIOLOGY  ED MD interpretation:    Official radiology report(s): No results found.  ____________________________________________   PROCEDURES  Procedure(s) performed (including Critical Care):  Procedures   ____________________________________________   INITIAL IMPRESSION / ASSESSMENT AND PLAN / ED COURSE  As part of my medical decision making, I reviewed the following data within the electronic MEDICAL RECORD NUMBER         Sonya Finley was evaluated in Emergency Department on 04/22/2019 for the symptoms described in the history of present illness. She was evaluated in the context of the global COVID-19 pandemic, which necessitated consideration that the patient might be at risk for infection with the SARS-CoV-2 virus that causes COVID-19. Institutional protocols and algorithms that pertain to the evaluation of patients at risk for COVID-19 are in a state of rapid change based on information released by regulatory bodies including the CDC and federal and state organizations. These  policies and algorithms were followed during the patient's care in the ED.  Patient presents with right lower dental pain for few days.  Patient physical exam revealed multiple caries with gingival edema.  Patient given discharge care instructions and advised take medication as directed.  Patient advised definitive relief can only be obtained through a dentist.      ____________________________________________   FINAL CLINICAL IMPRESSION(S) / ED DIAGNOSES  Final diagnoses:  Pain, dental     ED Discharge Orders         Ordered    amoxicillin (AMOXIL) 500 MG capsule  3 times daily     04/22/19 1522    traMADol (ULTRAM) 50 MG tablet  Every 6 hours PRN     04/22/19 1522    ibuprofen (ADVIL) 600 MG tablet  Every 8 hours PRN     04/22/19 1522           Note:  This document was prepared  using Conservation officer, historic buildingsDragon voice recognition software and may include unintentional dictation errors.    Joni ReiningSmith, Ronald K, PA-C 04/22/19 1524    Emily FilbertWilliams, Jonathan E, MD 04/22/19 [phone]

## 2019-04-22 NOTE — ED Triage Notes (Signed)
Pt in via POV, reports right lower dental pain since Sunday, some swelling noted to right jaw.  NAD noted at this time.

## 2019-04-22 NOTE — ED Notes (Signed)
See triage note  Presents with right lower gum line pain

## 2020-04-12 ENCOUNTER — Other Ambulatory Visit: Payer: Self-pay

## 2020-04-12 DIAGNOSIS — N6320 Unspecified lump in the left breast, unspecified quadrant: Secondary | ICD-10-CM

## 2020-04-22 ENCOUNTER — Ambulatory Visit
Admission: RE | Admit: 2020-04-22 | Discharge: 2020-04-22 | Disposition: A | Discharge status: Home / Self Care | Payer: No Typology Code available for payment source | Source: Ambulatory Visit | Attending: Obstetrics and Gynecology | Admitting: Obstetrics and Gynecology

## 2020-04-22 ENCOUNTER — Other Ambulatory Visit: Payer: Self-pay

## 2020-04-22 ENCOUNTER — Ambulatory Visit: Payer: No Typology Code available for payment source | Admitting: *Deleted

## 2020-04-22 VITALS — BP 106/82 | Wt 119.8 lb

## 2020-04-22 DIAGNOSIS — Z01419 Encounter for gynecological examination (general) (routine) without abnormal findings: Secondary | ICD-10-CM

## 2020-04-22 DIAGNOSIS — N6325 Unspecified lump in the left breast, overlapping quadrants: Secondary | ICD-10-CM

## 2020-04-22 DIAGNOSIS — N6320 Unspecified lump in the left breast, unspecified quadrant: Secondary | ICD-10-CM

## 2020-04-22 DIAGNOSIS — N644 Mastodynia: Secondary | ICD-10-CM

## 2020-04-22 NOTE — Progress Notes (Signed)
Sonya Finley is a 34 y.o. No obstetric history on file. female who presents to [facility] clinic today with complaint of left breast lump since April 10, 2020 that is tender when touched. Patient rates the pain at a 2 out of 10. Complained of possible right breast lump in same area as left since April 18, 2020.    Pap Smear: Pap smear completed today. Last Pap smear was 5 years ago at a [facility] in [place] and was normal per patient. Per patient has no history of an abnormal Pap smear. Last Pap smear result is not available in Epic.   Physical exam: Breasts Breasts symmetrical. No skin abnormalities bilateral breasts. No nipple retraction bilateral breasts. No nipple discharge bilateral breasts. No lymphadenopathy. No lumps palpated right breast. Palpated a lump versus swollen rib left inner breast 9 o'clock 5 cm from the nipple. Complaints of diffuse left breast pain on exam.       Pelvic/Bimanual Ext Genitalia No lesions, no swelling and no discharge observed on external genitalia.        Vagina Vagina pink and normal texture. No lesions or discharge observed in vagina.        Cervix Cervix is present. Cervix pink and of normal texture. No discharge observed.    Uterus Uterus is present and palpable. Uterus in normal position and normal size.        Adnexae Bilateral ovaries present and palpable. No tenderness on palpation.         Rectovaginal No rectal exam completed today since patient had no rectal complaints. No skin abnormalities observed on exam.     Smoking History: Patient is a current smoker. Discussed smoking cessation with patient. Referred to the [facility] Quitline and gave resources to the free smoking cessation classes at [facility].   Patient Navigation: Patient education provided. Access to services provided for patient through B[facility]CP program.    Breast and Cervical Cancer Risk Assessment: Patient does not have family history of breast cancer, known  genetic mutations, or radiation treatment to the chest before age 73. Patient does not have history of cervical dysplasia, immunocompromised, or DES exposure in-utero. Breast cancer risk assessment completed. No breast cancer risk calculated due to patient is less than 64 years old.  Risk Assessment    Risk Scores      04/22/2020   Last edited by: Meryl Dare, CMA   5-year risk:    Lifetime risk:           A: B[facility]CP exam with pap smear Complaint of bilateral breast lumps and pain.  P: Referred patient to the [facility] for a diagnostic mammogram. Appointment scheduled Thursday, April 22, 2020 at 1010.  [name], RN 04/22/2020 8:37 AM

## 2020-04-22 NOTE — Patient Instructions (Signed)
Explained breast self awareness with [place]. Pap smear completed today. Let her know B[facility]CP will cover Pap smears and HPV typing every 5 years unless has a history of abnormal Pap smears. Referred patient to the [facility] for a diagnostic mammogram. Appointment scheduled Thursday, April 22, 2020 at 1010. Patient aware of appointment and will be there. Let patient know will follow up with her within the next couple weeks with results of Pap smear by letter or phone. Discussed smoking cessation with patient. Referred to the [facility] Quitline and gave resources to the free smoking cessation classes at [facility]. [place] verbalized understanding.  Assyria Morreale, Kathaleen Maser, RN 8:37 AM

## 2020-04-26 LAB — CYTOLOGY - PAP
Comment: NEGATIVE
Diagnosis: NEGATIVE
High risk HPV: NEGATIVE

## 2021-05-05 ENCOUNTER — Encounter: Payer: Self-pay | Admitting: Oncology

## 2021-05-05 ENCOUNTER — Inpatient Hospital Stay: Payer: 59 | Attending: Oncology | Admitting: Oncology

## 2021-05-05 ENCOUNTER — Inpatient Hospital Stay: Payer: 59 | Attending: Oncology

## 2021-05-05 ENCOUNTER — Other Ambulatory Visit: Payer: Self-pay

## 2021-05-05 VITALS — BP 132/97 | HR 96 | Temp 97.7°F | Wt 105.0 lb

## 2021-05-05 DIAGNOSIS — D509 Iron deficiency anemia, unspecified: Secondary | ICD-10-CM | POA: Diagnosis not present

## 2021-05-05 DIAGNOSIS — E538 Deficiency of other specified B group vitamins: Secondary | ICD-10-CM | POA: Diagnosis not present

## 2021-05-05 DIAGNOSIS — D500 Iron deficiency anemia secondary to blood loss (chronic): Secondary | ICD-10-CM | POA: Insufficient documentation

## 2021-05-05 LAB — CBC WITH DIFFERENTIAL/PLATELET
Abs Immature Granulocytes: 0.02 10*3/uL (ref 0.00–0.07)
Basophils Absolute: 0 10*3/uL (ref 0.0–0.1)
Basophils Relative: 1 %
Eosinophils Absolute: 0 10*3/uL (ref 0.0–0.5)
Eosinophils Relative: 1 %
HCT: 32.1 % — ABNORMAL LOW (ref 36.0–46.0)
Hemoglobin: 9.6 g/dL — ABNORMAL LOW (ref 12.0–15.0)
Immature Granulocytes: 0 %
Lymphocytes Relative: 37 %
Lymphs Abs: 2.3 10*3/uL (ref 0.7–4.0)
MCH: 22.2 pg — ABNORMAL LOW (ref 26.0–34.0)
MCHC: 29.9 g/dL — ABNORMAL LOW (ref 30.0–36.0)
MCV: 74.3 fL — ABNORMAL LOW (ref 80.0–100.0)
Monocytes Absolute: 0.4 10*3/uL (ref 0.1–1.0)
Monocytes Relative: 6 %
Neutro Abs: 3.5 10*3/uL (ref 1.7–7.7)
Neutrophils Relative %: 55 %
Platelets: 384 10*3/uL (ref 150–400)
RBC: 4.32 MIL/uL (ref 3.87–5.11)
RDW: 15.9 % — ABNORMAL HIGH (ref 11.5–15.5)
WBC: 6.3 10*3/uL (ref 4.0–10.5)
nRBC: 0 % (ref 0.0–0.2)

## 2021-05-05 LAB — COMPREHENSIVE METABOLIC PANEL
ALT: 19 U/L (ref 0–44)
AST: 27 U/L (ref 15–41)
Albumin: 4 g/dL (ref 3.5–5.0)
Alkaline Phosphatase: 69 U/L (ref 38–126)
Anion gap: 10 (ref 5–15)
BUN: <5 mg/dL — ABNORMAL LOW (ref 6–20)
CO2: 23 mmol/L (ref 22–32)
Calcium: 8.8 mg/dL — ABNORMAL LOW (ref 8.9–10.3)
Chloride: 102 mmol/L (ref 98–111)
Creatinine, Ser: 0.66 mg/dL (ref 0.44–1.00)
GFR, Estimated: >60 mL/min (ref >60)
Glucose, Bld: 86 mg/dL (ref 70–99)
Potassium: 3.9 mmol/L (ref 3.5–5.1)
Sodium: 135 mmol/L (ref 135–145)
Total Bilirubin: 0.7 mg/dL (ref 0.3–1.2)
Total Protein: 7.9 g/dL (ref 6.5–8.1)

## 2021-05-05 LAB — IRON AND TIBC
Iron: 19 ug/dL — ABNORMAL LOW (ref 28–170)
Saturation Ratios: 4 % — ABNORMAL LOW (ref 10.4–31.8)
TIBC: 490 ug/dL — ABNORMAL HIGH (ref 250–450)
UIBC: 471 ug/dL

## 2021-05-05 LAB — FERRITIN: Ferritin: 5 ng/mL — ABNORMAL LOW (ref 11–307)

## 2021-05-05 LAB — RETIC PANEL
Immature Retic Fract: 14.8 % (ref 2.3–15.9)
RBC.: 4.42 MIL/uL (ref 3.87–5.11)
Retic Count, Absolute: 31.8 10*3/uL (ref 19.0–186.0)
Retic Ct Pct: 0.7 % (ref 0.4–3.1)
Reticulocyte Hemoglobin: 23.3 pg — ABNORMAL LOW (ref >27.9)

## 2021-05-05 LAB — VITAMIN B12: Vitamin B-12: 277 pg/mL (ref 180–914)

## 2021-05-05 NOTE — Progress Notes (Signed)
Hematology/Oncology Consult note [phone]       Patient Care Team: Perrin Maltese, MD as PCP - General (Internal Medicine)  REFERRING PROVIDER: Perrin Maltese, MD  CHIEF COMPLAINTS/REASON FOR VISIT:  Evaluation of iron deficiency anemia. HISTORY OF PRESENTING ILLNESS:   Sonya Finley is a  35 y.o.  female with PMH listed below was seen in consultation at the request of  Perrin Maltese, MD  for evaluation of iron deficiency anemia Reviewed blood work patient had done with primary care provider  03/29/2021, creatinine 0.72, WBC 6.3, hemoglobin 9.2, MCV 74, hematocrit 30.6, platelet 360 normal differential.  Urine negative for occult blood.  Iron saturation 3, ferritin 7, TIBC 427.  Patient reports that her menstrual period is not heavy.  Usually 4 to 5 days. Patient has family history of Crohn's disease.  Patient uses ibuprofen 1-2 times daily. Reports feeling fatigued and tired.  No pica symptoms.  She denies any bloody bowel movement or melena.  Denies any epigastric discomfort.  Review of Systems  Constitutional:  Positive for fatigue. Negative for appetite change, chills and fever.  HENT:   Negative for hearing loss and voice change.   Eyes:  Negative for eye problems.  Respiratory:  Negative for chest tightness and cough.   Cardiovascular:  Negative for chest pain.  Gastrointestinal:  Negative for abdominal distention, abdominal pain and blood in stool.  Endocrine: Negative for hot flashes.  Genitourinary:  Negative for difficulty urinating and frequency.   Musculoskeletal:  Negative for arthralgias.  Skin:  Negative for itching and rash.  Neurological:  Negative for extremity weakness.  Hematological:  Negative for adenopathy.  Psychiatric/Behavioral:  Negative for confusion.    MEDICAL HISTORY:  Past Medical History:  Diagnosis Date   Frequent headaches    GERD (gastroesophageal reflux disease)    H/O drug abuse ([place]) 2015   heroine, on  Methadone by [place]   History of migraine more as teenager   History of pyelonephritis 2013   History of UTI    last 2015   Hx of viral meningitis ~1995/96   hospitalized for 1 wk   Seasonal allergies     SURGICAL HISTORY: Past Surgical History:  Procedure Laterality Date   TONSILLECTOMY  ~1994/95    SOCIAL HISTORY: Social History   Socioeconomic History   Marital status: Single    Spouse name: Not on file   Number of children: Not on file   Years of education: Not on file   Highest education level: Not on file  Occupational History   Not on file  Tobacco Use   Smoking status: Every Day    Packs/day: 1.00    Types: Cigarettes    Start date: 06/05/2002   Smokeless tobacco: Never  Vaping Use   Vaping Use: Never used  Substance and Sexual Activity   Alcohol use: No    Alcohol/week: 0.0 standard drinks   Drug use: No    Comment: h/o heroine use   Sexual activity: Yes    Birth control/protection: None  Other Topics Concern   Not on file  Social History Narrative   Lives with mom and boyfriend, 1 dog, 6 cats   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[employer]   Activity: no regular exercise - remodeling house   Diet: some water, vegetables regularly   Social Determinants of Health   Financial Resource Strain: Not on file  Food Insecurity: Not on file  Transportation Needs: Not on file  Physical  Activity: Not on file  Stress: Not on file  Social Connections: Not on file  Intimate Partner Violence: Not on file    FAMILY HISTORY: Family History  Problem Relation Age of Onset   Crohn's disease Mother    Crohn's disease Maternal Aunt    CAD Maternal Grandmother    Alcohol abuse Father    Drug abuse Father    Cancer Neg Hx    Diabetes Neg Hx     ALLERGIES:  has No Known Allergies.  MEDICATIONS:  Current Outpatient Medications  Medication Sig Dispense Refill   escitalopram (LEXAPRO) 10 MG tablet Lexapro 10 MG Oral Tablet     METHADONE HCL PO Take 113 mg  by mouth daily.      amoxicillin (AMOXIL) 500 MG capsule Take 1 capsule (500 mg total) by mouth 3 (three) times daily. (Patient not taking: Reported on 04/22/2020) 30 capsule 0   ibuprofen (ADVIL) 600 MG tablet Take 1 tablet (600 mg total) by mouth every 8 (eight) hours as needed. 30 tablet 0   traMADol (ULTRAM) 50 MG tablet Take 1 tablet (50 mg total) by mouth every 6 (six) hours as needed for moderate pain. (Patient not taking: Reported on 04/22/2020) 12 tablet 0   No current facility-administered medications for this visit.     PHYSICAL EXAMINATION: ECOG PERFORMANCE STATUS: 0 - Asymptomatic Vitals:   05/05/21 1513  BP: (!) 132/97  Pulse: 96  Temp: 97.7 F (36.5 C)   Filed Weights   05/05/21 1513  Weight: 105 lb (47.6 kg)    Physical Exam  LABORATORY DATA:  I have reviewed the data as listed Lab Results  Component Value Date   WBC 6.3 05/05/2021   HGB 9.6 (L) 05/05/2021   HCT 32.1 (L) 05/05/2021   MCV 74.3 (L) 05/05/2021   PLT 384 05/05/2021   Recent Labs    05/05/21 1610  NA 135  K 3.9  CL 102  CO2 23  GLUCOSE 86  BUN <5*  CREATININE 0.66  CALCIUM 8.8*  GFRNONAA >60  PROT 7.9  ALBUMIN 4.0  AST 27  ALT 19  ALKPHOS 69  BILITOT 0.7   Iron/TIBC/Ferritin/ %Sat    Component Value Date/Time   IRON 19 (L) 05/05/2021 1610   TIBC 490 (H) 05/05/2021 1610   FERRITIN 5 (L) 05/05/2021 1610   IRONPCTSAT 4 (L) 05/05/2021 1610      RADIOGRAPHIC STUDIES: I have personally reviewed the radiological images as listed and agreed with the findings in the report. No results found.    ASSESSMENT & PLAN:  1. Iron deficiency anemia, unspecified iron deficiency anemia type   2. B12 deficiency   Anemia, iron deficiency Labs reviewed and discussed with patient. I will repeat CBC, iron TIBC ferritin, CMP, reticulocyte panel vitamin B12.  I discussed patient about the rationale potential side effects of oral iron supplementation versus IV Venofer treatments.  Patient  opted to IV Venofer treatments. Plan IV iron with Venofer 200mg  weekly x 4 doses. Allergy reactions/infusion reaction including anaphylactic reaction discussed with patient. Other side effects include but not limited to high blood pressure, skin rash, weight gain, leg swelling, etc. Patient voices understanding and willing to proceed.  She will have pregnancy testing done prior to each Venofer treatments.  Vitamin B12 level is borderline.  Recommend patient to take oral vitamin B12 1000 MCG daily.  Etiology of iron deficiency is unclear.  She denies history of the symptoms of menorrhagia.  Chronic NSAID use.  Family history  of inflammatory bowel disease.  Refer to gastroenterology for further evaluation.  Orders Placed This Encounter  Procedures   CBC with Differential/Platelet    Standing Status:   Future    Number of Occurrences:   1    Standing Expiration Date:   05/05/2022   Comprehensive metabolic panel    Standing Status:   Future    Number of Occurrences:   1    Standing Expiration Date:   05/05/2022   Ferritin    Standing Status:   Future    Number of Occurrences:   1    Standing Expiration Date:   05/05/2022   Iron and TIBC    Standing Status:   Future    Number of Occurrences:   1    Standing Expiration Date:   05/05/2022   Vitamin B12    Standing Status:   Future    Number of Occurrences:   1    Standing Expiration Date:   05/05/2022   Retic Panel    Standing Status:   Future    Number of Occurrences:   1    Standing Expiration Date:   05/05/2022   Pregnancy, urine    Standing Status:   Standing    Number of Occurrences:   4    Standing Expiration Date:   05/05/2022    All questions were answered. The patient knows to call the clinic with any problems questions or concerns.   Margaretann Loveless, MD    Return of visit: 3 months. Thank you for this kind referral and the opportunity to participate in the care of this patient. A copy of today's note is routed to referring  provider   Rickard Patience, MD, PhD  05/05/2021

## 2021-05-10 ENCOUNTER — Inpatient Hospital Stay: Payer: 59

## 2021-05-12 ENCOUNTER — Inpatient Hospital Stay: Payer: 59

## 2021-05-12 ENCOUNTER — Other Ambulatory Visit: Payer: Self-pay | Admitting: *Deleted

## 2021-05-12 ENCOUNTER — Other Ambulatory Visit: Payer: Self-pay

## 2021-05-12 DIAGNOSIS — D509 Iron deficiency anemia, unspecified: Secondary | ICD-10-CM

## 2021-05-12 DIAGNOSIS — E538 Deficiency of other specified B group vitamins: Secondary | ICD-10-CM

## 2021-05-12 LAB — PREGNANCY, URINE: Preg Test, Ur: NEGATIVE

## 2021-05-12 NOTE — Patient Instructions (Signed)
Patient arrived to infusion center for first Venofer infusion. Manual BP 170/100. Per Dr. Cathie Hoops, hold Venofer today and patient to reschedule. I instructed patient to write down her BP at home (if unable to check at home, try a local drugstore/[facility]) to see if it is consistently this high. If accompanied by headache, chest pain, shortness of breath, seek evaluation in ER. She will return for Venofer next week (with hcg prior to infusion).

## 2021-05-19 ENCOUNTER — Inpatient Hospital Stay: Payer: 59

## 2021-05-19 ENCOUNTER — Other Ambulatory Visit: Payer: Self-pay

## 2021-05-19 VITALS — BP 150/102 | HR 94 | Temp 97.9°F

## 2021-05-19 DIAGNOSIS — D509 Iron deficiency anemia, unspecified: Secondary | ICD-10-CM

## 2021-05-19 LAB — PREGNANCY, URINE: Preg Test, Ur: NEGATIVE

## 2021-05-19 MED ORDER — SODIUM CHLORIDE 0.9 % IV SOLN: 200.0000 mg | Freq: Once | INTRAVENOUS | Status: DC

## 2021-05-19 MED ORDER — SODIUM CHLORIDE 0.9 % IV SOLN
Freq: Once | INTRAVENOUS | Status: AC
  Filled 2021-05-19: qty 250

## 2021-05-19 MED ORDER — IRON SUCROSE 20 MG/ML IV SOLN
200.0000 mg | Freq: Once | INTRAVENOUS | Status: AC
  Administered 2021-05-19: 200 mg via INTRAVENOUS
  Filled 2021-05-19: qty 10

## 2021-05-19 NOTE — Progress Notes (Signed)
Ok to tx per MD

## 2021-05-26 ENCOUNTER — Inpatient Hospital Stay: Payer: 59

## 2021-05-26 ENCOUNTER — Other Ambulatory Visit: Payer: Self-pay

## 2021-05-26 DIAGNOSIS — D509 Iron deficiency anemia, unspecified: Secondary | ICD-10-CM

## 2021-05-26 LAB — PREGNANCY, URINE: Preg Test, Ur: NEGATIVE

## 2021-05-26 MED ORDER — IRON SUCROSE 20 MG/ML IV SOLN
200.0000 mg | Freq: Once | INTRAVENOUS | Status: AC
  Administered 2021-05-26: 15:00:00 200 mg via INTRAVENOUS

## 2021-05-26 MED ORDER — SODIUM CHLORIDE 0.9 % IV SOLN: 200.0000 mg | Freq: Once | INTRAVENOUS | Status: DC

## 2021-05-26 MED ORDER — SODIUM CHLORIDE 0.9 % IV SOLN
Freq: Once | INTRAVENOUS | Status: AC
  Filled 2021-05-26: qty 250

## 2021-06-01 ENCOUNTER — Other Ambulatory Visit: Payer: Self-pay | Admitting: *Deleted

## 2021-06-01 DIAGNOSIS — D509 Iron deficiency anemia, unspecified: Secondary | ICD-10-CM

## 2021-06-02 ENCOUNTER — Inpatient Hospital Stay: Payer: 59

## 2021-06-03 ENCOUNTER — Other Ambulatory Visit: Payer: Self-pay

## 2021-06-03 ENCOUNTER — Inpatient Hospital Stay: Payer: 59

## 2021-06-03 VITALS — BP 127/91 | HR 90 | Temp 98.2°F

## 2021-06-03 DIAGNOSIS — D509 Iron deficiency anemia, unspecified: Secondary | ICD-10-CM

## 2021-06-03 LAB — PREGNANCY, URINE: Preg Test, Ur: NEGATIVE

## 2021-06-03 MED ORDER — SODIUM CHLORIDE 0.9 % IV SOLN: 200.0000 mg | Freq: Once | INTRAVENOUS | Status: DC

## 2021-06-03 MED ORDER — IRON SUCROSE 20 MG/ML IV SOLN
200.0000 mg | Freq: Once | INTRAVENOUS | Status: AC
  Administered 2021-06-03: 14:00:00 200 mg via INTRAVENOUS
  Filled 2021-06-03: qty 10

## 2021-06-03 MED ORDER — SODIUM CHLORIDE 0.9 % IV SOLN
Freq: Once | INTRAVENOUS | Status: AC
  Filled 2021-06-03: qty 250

## 2021-06-03 NOTE — Patient Instructions (Signed)
[facility]  Discharge Instructions: ?Thank you for choosing [facility] to provide your oncology and hematology care.  ?If you have a lab appointment with the Cancer Center, please go directly to the Cancer Center and check in at the registration area. ? ?Wear comfortable clothing and clothing appropriate for easy access to any Portacath or PICC line.  ? ?We strive to give you quality time with your provider. You may need to reschedule your appointment if you arrive late (15 or more minutes).  Arriving late affects you and other patients whose appointments are after yours.  Also, if you miss three or more appointments without notifying the office, you may be dismissed from the clinic at the provider?s discretion.    ?  ?For prescription refill requests, have your pharmacy contact our office and allow 72 hours for refills to be completed.   ? ?Today you received the following chemotherapy and/or immunotherapy agents VENOFER    ?  ?To help prevent nausea and vomiting after your treatment, we encourage you to take your nausea medication as directed. ? ?BELOW ARE SYMPTOMS THAT SHOULD BE REPORTED IMMEDIATELY: ?*FEVER GREATER THAN 100.4 F (38 ?C) OR HIGHER ?*CHILLS OR SWEATING ?*NAUSEA AND VOMITING THAT IS NOT CONTROLLED WITH YOUR NAUSEA MEDICATION ?*UNUSUAL SHORTNESS OF BREATH ?*UNUSUAL BRUISING OR BLEEDING ?*URINARY PROBLEMS (pain or burning when urinating, or frequent urination) ?*BOWEL PROBLEMS (unusual diarrhea, constipation, pain near the anus) ?TENDERNESS IN MOUTH AND THROAT WITH OR WITHOUT PRESENCE OF ULCERS (sore throat, sores in mouth, or a toothache) ?UNUSUAL RASH, SWELLING OR PAIN  ?UNUSUAL VAGINAL DISCHARGE OR ITCHING  ? ?Items with * indicate a potential emergency and should be followed up as soon as possible or go to the Emergency Department if any problems should occur. ? ?Please show the CHEMOTHERAPY ALERT CARD or IMMUNOTHERAPY ALERT CARD at check-in to the  Emergency Department and triage nurse. ? ?Should you have questions after your visit or need to cancel or reschedule your appointment, please contact [facility]  [phone] and follow the prompts.  Office hours are 8:00 a.m. to 4:30 p.m. Monday - Friday. Please note that voicemails left after 4:00 p.m. may not be returned until the following business day.  We are closed weekends and major holidays. You have access to a nurse at all times for urgent questions. Please call the main number to the clinic [phone] and follow the prompts. ? ?For any non-urgent questions, you may also contact your provider using MyChart. We now offer e-Visits for anyone 18 and older to request care online for non-urgent symptoms. For details visit [email or web address]. ?  ?Also download the MyChart app! Go to the app store, search "MyChart", open the app, select [facility], and log in with your MyChart username and password. ? ?Due to Covid, a mask is required upon entering the hospital/clinic. If you do not have a mask, one will be given to you upon arrival. For doctor visits, patients may have 1 support person aged 18 or older with them. For treatment visits, patients cannot have anyone with them due to current Covid guidelines and our immunocompromised population.  ? ?Iron Sucrose Injection ?What is this medication? ?IRON SUCROSE (EYE ern SOO krose) treats low levels of iron (iron deficiency anemia) in people with kidney disease. Iron is a mineral that plays an important role in making red blood cells, which carry oxygen from your lungs to the rest of your body. ?This medicine may   be used for other purposes; ask your health care provider or pharmacist if you have questions. ?COMMON BRAND NAME(S): Venofer ?What should I tell my care team before I take this medication? ?They need to know if you have any of these conditions: ?Anemia not caused by low iron levels ?Heart disease ?High levels of  iron in the blood ?Kidney disease ?Liver disease ?An unusual or allergic reaction to iron, other medications, foods, dyes, or preservatives ?Pregnant or trying to get pregnant ?Breast-feeding ?How should I use this medication? ?This medication is for infusion into a vein. It is given in a hospital or clinic setting. ?Talk to your care team about the use of this medication in children. While this medication may be prescribed for children as young as 2 years for selected conditions, precautions do apply. ?Overdosage: If you think you have taken too much of this medicine contact a poison control center or emergency room at once. ?NOTE: This medicine is only for you. Do not share this medicine with others. ?What if I miss a dose? ?It is important not to miss your dose. Call your care team if you are unable to keep an appointment. ?What may interact with this medication? ?Do not take this medication with any of the following: ?Deferoxamine ?Dimercaprol ?Other iron products ?This medication may also interact with the following: ?Chloramphenicol ?Deferasirox ?This list may not describe all possible interactions. Give your health care provider a list of all the medicines, herbs, non-prescription drugs, or dietary supplements you use. Also tell them if you smoke, drink alcohol, or use illegal drugs. Some items may interact with your medicine. ?What should I watch for while using this medication? ?Visit your care team regularly. Tell your care team if your symptoms do not start to get better or if they get worse. You may need blood work done while you are taking this medication. ?You may need to follow a special diet. Talk to your care team. Foods that contain iron include: whole grains/cereals, dried fruits, beans, or peas, leafy green vegetables, and organ meats (liver, kidney). ?What side effects may I notice from receiving this medication? ?Side effects that you should report to your care team as soon as  possible: ?Allergic reactions--skin rash, itching, hives, swelling of the face, lips, tongue, or throat ?Low blood pressure--dizziness, feeling faint or lightheaded, blurry vision ?Shortness of breath ?Side effects that usually do not require medical attention (report to your care team if they continue or are bothersome): ?Flushing ?Headache ?Joint pain ?Muscle pain ?Nausea ?Pain, redness, or irritation at injection site ?This list may not describe all possible side effects. Call your doctor for medical advice about side effects. You may report side effects to FDA at 1-800-FDA-1088. ?Where should I keep my medication? ?This medication is given in a hospital or clinic and will not be stored at home. ?NOTE: This sheet is a summary. It may not cover all possible information. If you have questions about this medicine, talk to your doctor, pharmacist, or health care provider. ?? 2022 Elsevier/Gold Standard (2020-10-15 00:00:00) ? ?

## 2021-06-09 ENCOUNTER — Inpatient Hospital Stay: Payer: 59 | Attending: Oncology

## 2021-06-09 ENCOUNTER — Inpatient Hospital Stay: Payer: 59

## 2021-06-09 ENCOUNTER — Other Ambulatory Visit: Payer: Self-pay

## 2021-06-09 VITALS — BP 144/89 | HR 74 | Temp 97.6°F | Resp 18

## 2021-06-09 DIAGNOSIS — D509 Iron deficiency anemia, unspecified: Secondary | ICD-10-CM | POA: Diagnosis present

## 2021-06-09 LAB — PREGNANCY, URINE: Preg Test, Ur: NEGATIVE

## 2021-06-09 MED ORDER — IRON SUCROSE 20 MG/ML IV SOLN
200.0000 mg | Freq: Once | INTRAVENOUS | Status: AC
  Administered 2021-06-09: 200 mg via INTRAVENOUS
  Filled 2021-06-09: qty 10

## 2021-06-09 MED ORDER — SODIUM CHLORIDE 0.9 % IV SOLN
Freq: Once | INTRAVENOUS | Status: AC
  Filled 2021-06-09: qty 250

## 2021-06-09 MED ORDER — SODIUM CHLORIDE 0.9 % IV SOLN: 200.0000 mg | Freq: Once | INTRAVENOUS | Status: DC

## 2021-06-09 NOTE — Patient Instructions (Signed)
[facility]   ?Discharge Instructions: ?Thank you for choosing [facility] to provide your oncology and hematology care.  ?If you have a lab appointment with the Cancer Center, please go directly to the Cancer Center and check in at the registration area. ?  ?We strive to give you quality time with your provider. You may need to reschedule your appointment if you arrive late (15 or more minutes).  Arriving late affects you and other patients whose appointments are after yours.  Also, if you miss three or more appointments without notifying the office, you may be dismissed from the clinic at the provider?s discretion.    ?  ?For prescription refill requests, have your pharmacy contact our office and allow 72 hours for refills to be completed.   ? ?Today you received the following: Venofer.    ?  ?BELOW ARE SYMPTOMS THAT SHOULD BE REPORTED IMMEDIATELY: ?*FEVER GREATER THAN 100.4 F (38 ?C) OR HIGHER ?*CHILLS OR SWEATING ?*NAUSEA AND VOMITING THAT IS NOT CONTROLLED WITH YOUR NAUSEA MEDICATION ?*UNUSUAL SHORTNESS OF BREATH ?*UNUSUAL BRUISING OR BLEEDING ?*URINARY PROBLEMS (pain or burning when urinating, or frequent urination) ?*BOWEL PROBLEMS (unusual diarrhea, constipation, pain near the anus) ?TENDERNESS IN MOUTH AND THROAT WITH OR WITHOUT PRESENCE OF ULCERS (sore throat, sores in mouth, or a toothache) ?UNUSUAL RASH, SWELLING OR PAIN  ?UNUSUAL VAGINAL DISCHARGE OR ITCHING  ? ?Items with * indicate a potential emergency and should be followed up as soon as possible or go to the Emergency Department if any problems should occur. ? ?Should you have questions after your visit or need to cancel or reschedule your appointment, please contact [facility]  Dept: [phone]  and follow the prompts.  Office hours are 8:00 a.m. to 4:30 p.m. Monday - Friday. Please note that voicemails left after 4:00 p.m. may not be returned until the following  business day.  We are closed weekends and major holidays. You have access to a nurse at all times for urgent questions. Please call the main number to the clinic Dept: [phone] and follow the prompts. ? ?For any non-urgent questions, you may also contact your provider using MyChart. We now offer e-Visits for anyone 18 and older to request care online for non-urgent symptoms. For details visit [email or web address]. ?  ?Also download the MyChart app! Go to the app store, search "MyChart", open the app, select [facility], and log in with your MyChart username and password. ? ?Due to Covid, a mask is required upon entering the hospital/clinic. If you do not have a mask, one will be given to you upon arrival. For doctor visits, patients may have 1 support person aged 18 or older with them. For treatment visits, patients cannot have anyone with them due to current Covid guidelines and our immunocompromised population.  ?

## 2021-06-24 IMAGING — MG DIGITAL DIAGNOSTIC BILAT W/ TOMO W/ CAD
6 of 10 series · 6 of 30 positions shown · non-contrast
Comparison: None.

CLINICAL DATA: 33-year-old presenting with a possible palpable lump
in the INNER LEFT breast. This is the patient's initial baseline
mammogram.

Patient states no family history of breast cancer.
EXAM:
DIGITAL DIAGNOSTIC BILATERAL MAMMOGRAM WITH CAD AND TOMO
ULTRASOUND LEFT BREAST

[R CC synth-2D]
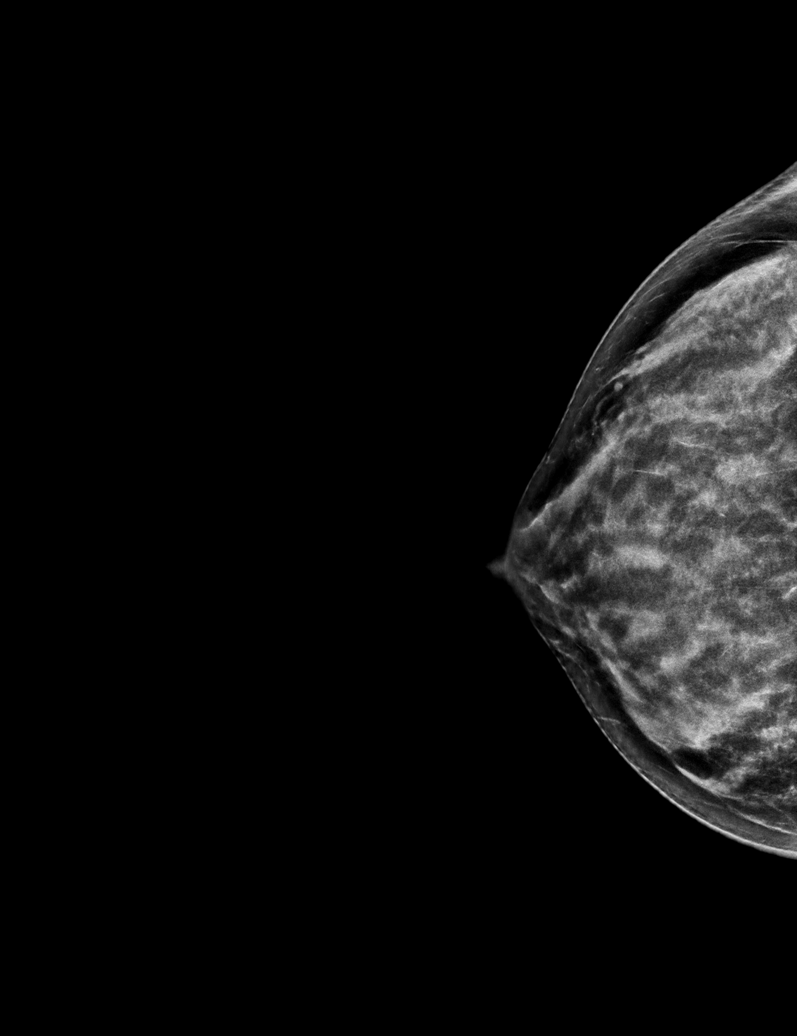

[L MLO synth-2D]
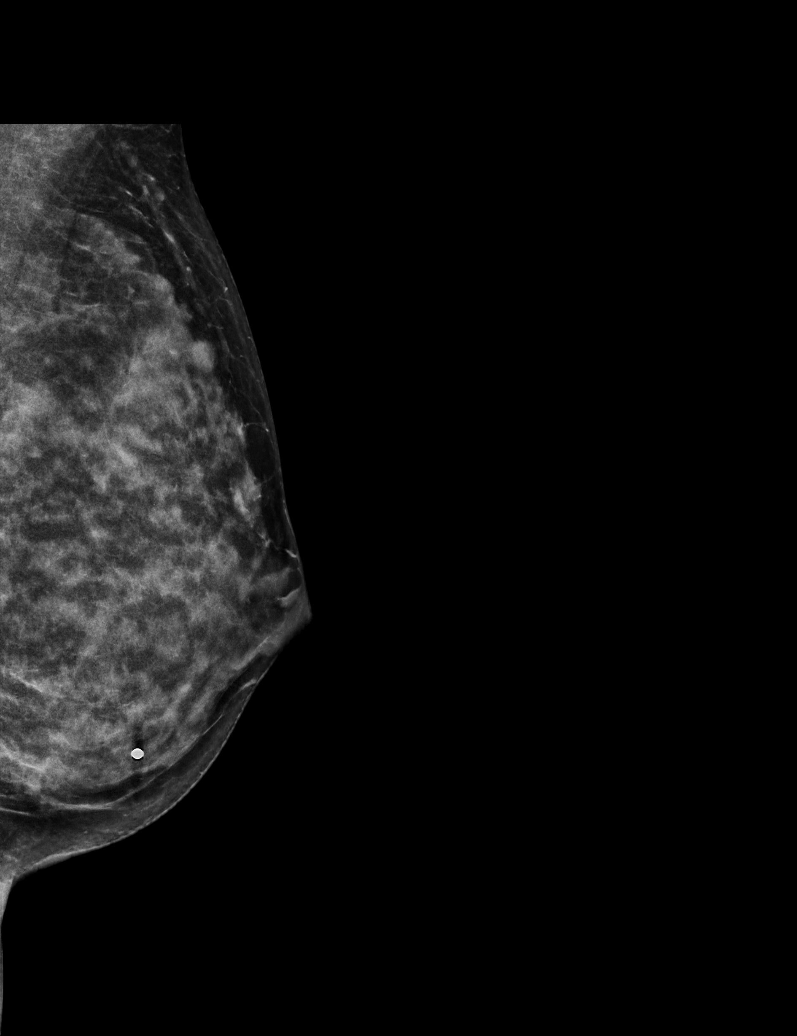

[L TAN synth-2D]
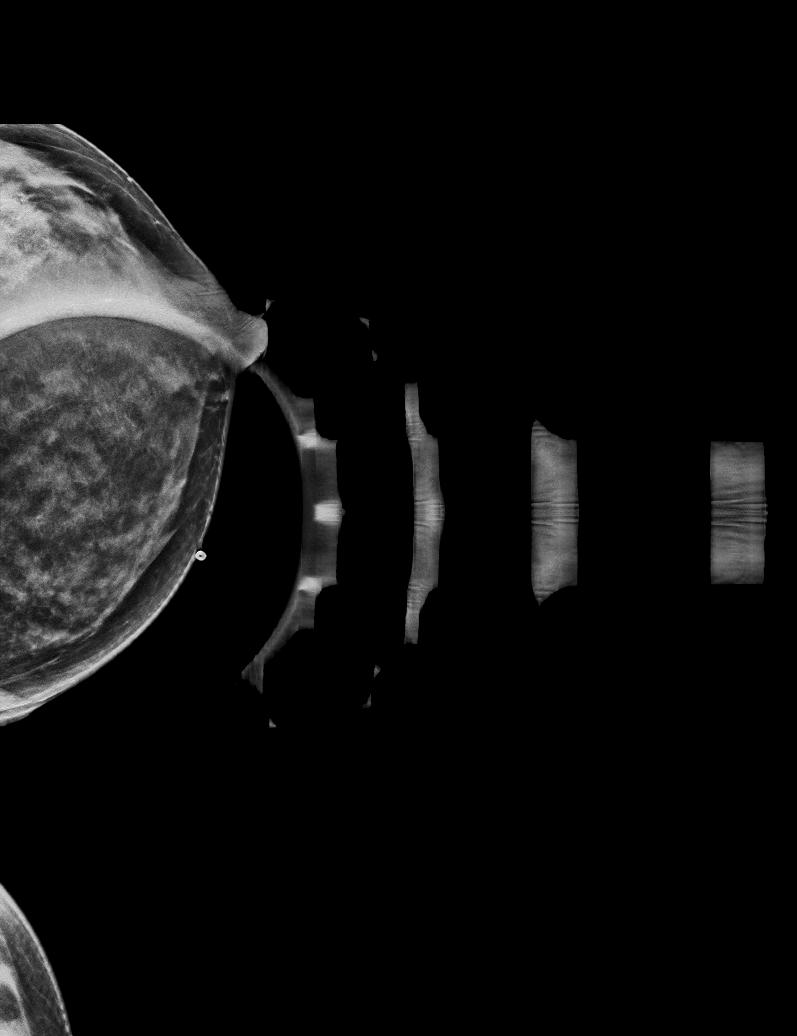

[L CC synth-2D]
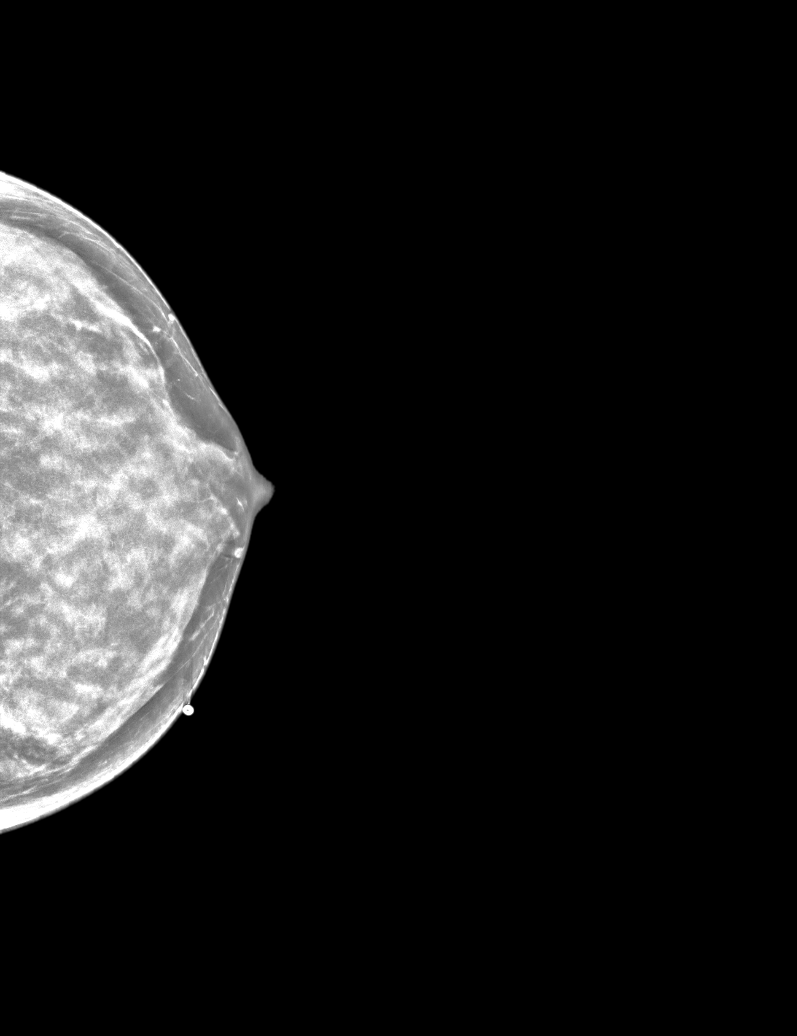

[R MLO synth-2D]
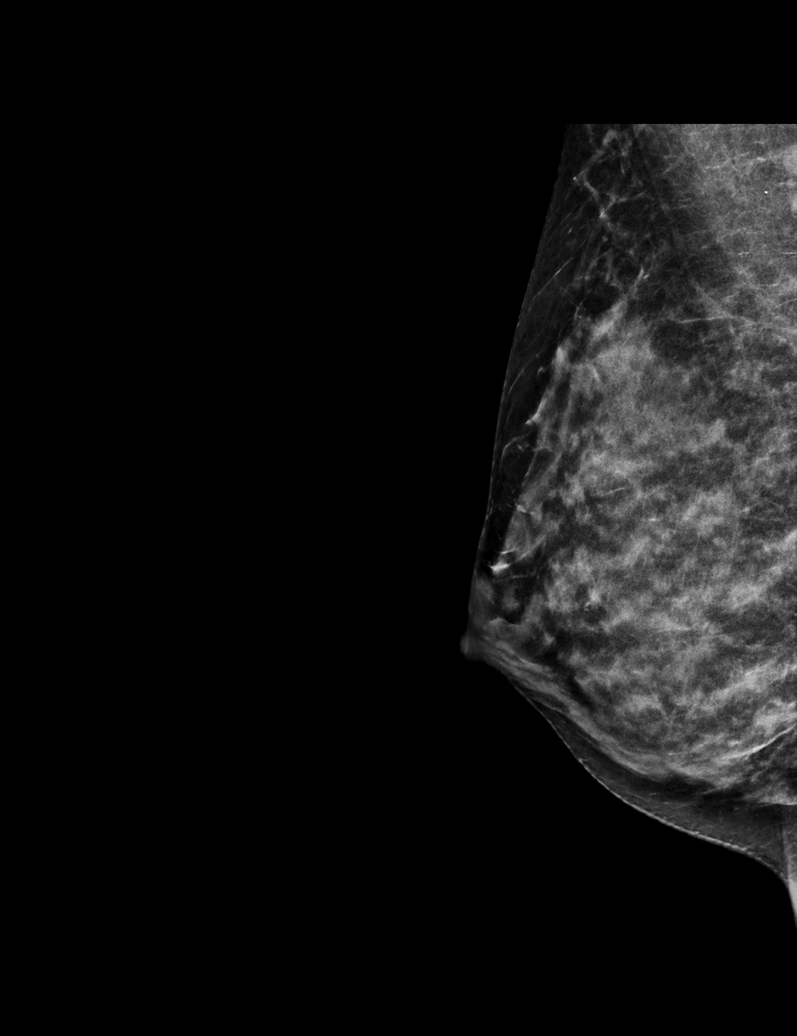

[L MLO tomo · tomo slice 26/51.0]
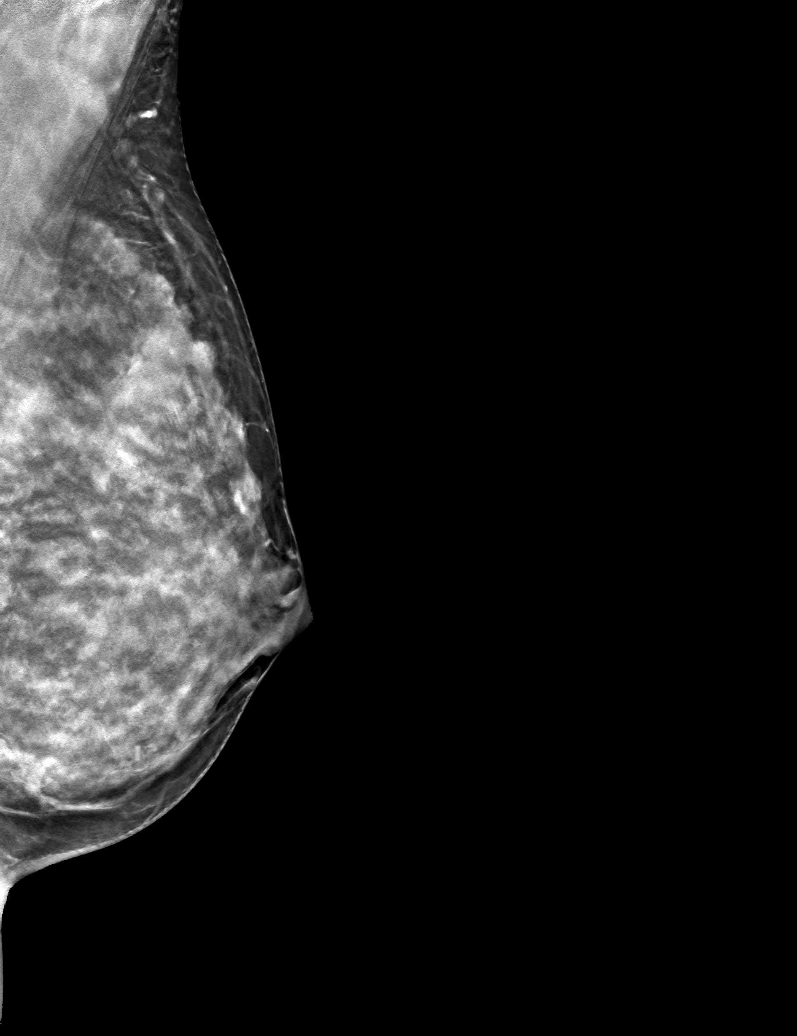

[6 of 30 positions shown; findings below may reference images not displayed]

ACR Breast Density Category d: The breast tissue is extremely dense,
which lowers the sensitivity of mammography.
FINDINGS: Tomosynthesis and synthesized full field CC and MLO views of both
breasts were obtained. Tomosynthesis and synthesized spot
compression tangential view of the area of concern in the LEFT
breast was also obtained. Mammographic images were processed with
CAD.

LEFT: No mammographic abnormality in the area of palpable concern in
the INNER breast. Normal dense fibroglandular tissue is present in
this location.

A circumscribed isodense mass is present in the UPPER OUTER QUADRANT
at POSTERIOR depth without associated architectural distortion or
suspicious calcifications. No suspicious findings elsewhere.

Targeted LEFT breast ultrasound is performed, showing an oval
parallel nearly anechoic mass with scattered internal echoes at the
9 o'clock position approximately 5 cm from the nipple at POSTERIOR
depth measuring approximately 6 x 3 x 6 mm, demonstrating posterior
acoustic enhancement and no internal power Doppler flow,
corresponding to the area of palpable concern.

At the 2 o'clock position approximately 4 cm from the nipple is a
benign simple cyst measuring approximately 10 x 5 x 9 mm,
demonstrating posterior acoustic enhancement and no internal power
Doppler flow, corresponding to the mammographic finding. There is an
adjacent smaller benign cyst at this location.

No suspicious solid mass or abnormal acoustic shadowing is
identified.

RIGHT: No findings suspicious for malignancy.

On correlative physical exam, the cyst in the INNER breast is
palpable, and is located on top of the costal cartilage of an
ANTERIOR rib.
IMPRESSION: 1. No mammographic or sonographic evidence of malignancy involving
the LEFT breast.
2. No mammographic evidence of malignancy involving the RIGHT
breast.
3. Benign cysts in the INNER LEFT breast (accounting for the
palpable concern) and in the UPPER OUTER QUADRANT of the LEFT breast
(accounting for a mammographic finding).

RECOMMENDATION:
Screening mammogram at age 40 unless there are persistent or
subsequent clinical concerns. (Code:59-4-U0Y)

I have discussed the findings and recommendations with the patient.

BI-RADS CATEGORY  2: Benign.

## 2021-08-01 ENCOUNTER — Inpatient Hospital Stay: Payer: 59

## 2021-08-03 ENCOUNTER — Inpatient Hospital Stay: Payer: 59

## 2021-08-03 ENCOUNTER — Encounter: Payer: Self-pay | Admitting: Oncology

## 2021-08-03 ENCOUNTER — Inpatient Hospital Stay: Payer: 59 | Attending: Oncology | Admitting: Oncology

## 2021-08-03 ENCOUNTER — Other Ambulatory Visit: Payer: Self-pay

## 2021-08-03 VITALS — BP 100/64 | HR 89 | Temp 97.5°F | Wt 109.0 lb

## 2021-08-03 DIAGNOSIS — D509 Iron deficiency anemia, unspecified: Secondary | ICD-10-CM | POA: Diagnosis not present

## 2021-08-03 DIAGNOSIS — E538 Deficiency of other specified B group vitamins: Secondary | ICD-10-CM | POA: Diagnosis not present

## 2021-08-03 LAB — CBC WITH DIFFERENTIAL/PLATELET
Abs Immature Granulocytes: 0.03 10*3/uL (ref 0.00–0.07)
Basophils Absolute: 0.1 10*3/uL (ref 0.0–0.1)
Basophils Relative: 1 %
Eosinophils Absolute: 0.1 10*3/uL (ref 0.0–0.5)
Eosinophils Relative: 1 %
HCT: 40.5 % (ref 36.0–46.0)
Hemoglobin: 12.9 g/dL (ref 12.0–15.0)
Immature Granulocytes: 0 %
Lymphocytes Relative: 27 %
Lymphs Abs: 2.1 10*3/uL (ref 0.7–4.0)
MCH: 28.1 pg (ref 26.0–34.0)
MCHC: 31.9 g/dL (ref 30.0–36.0)
MCV: 88.2 fL (ref 80.0–100.0)
Monocytes Absolute: 0.5 10*3/uL (ref 0.1–1.0)
Monocytes Relative: 6 %
Neutro Abs: 5 10*3/uL (ref 1.7–7.7)
Neutrophils Relative %: 65 %
Platelets: 268 10*3/uL (ref 150–400)
RBC: 4.59 MIL/uL (ref 3.87–5.11)
RDW: 19.8 % — ABNORMAL HIGH (ref 11.5–15.5)
WBC: 7.7 10*3/uL (ref 4.0–10.5)
nRBC: 0 % (ref 0.0–0.2)

## 2021-08-03 LAB — COMPREHENSIVE METABOLIC PANEL
ALT: 32 U/L (ref 0–44)
AST: 29 U/L (ref 15–41)
Albumin: 3.8 g/dL (ref 3.5–5.0)
Alkaline Phosphatase: 53 U/L (ref 38–126)
Anion gap: 3 — ABNORMAL LOW (ref 5–15)
BUN: 9 mg/dL (ref 6–20)
CO2: 28 mmol/L (ref 22–32)
Calcium: 8.7 mg/dL — ABNORMAL LOW (ref 8.9–10.3)
Chloride: 104 mmol/L (ref 98–111)
Creatinine, Ser: 0.75 mg/dL (ref 0.44–1.00)
GFR, Estimated: >60 mL/min (ref >60)
Glucose, Bld: 95 mg/dL (ref 70–99)
Potassium: 4 mmol/L (ref 3.5–5.1)
Sodium: 135 mmol/L (ref 135–145)
Total Bilirubin: 0.1 mg/dL — ABNORMAL LOW (ref 0.3–1.2)
Total Protein: 7.2 g/dL (ref 6.5–8.1)

## 2021-08-03 LAB — FERRITIN: Ferritin: 28 ng/mL (ref 11–307)

## 2021-08-03 LAB — IRON AND TIBC
Iron: 78 ug/dL (ref 28–170)
Saturation Ratios: 19 % (ref 10.4–31.8)
TIBC: 421 ug/dL (ref 250–450)
UIBC: 343 ug/dL

## 2021-08-03 NOTE — Progress Notes (Signed)
?Hematology/Oncology Progress note ?[phone] [phone] ?  ? ? ? ?Patient Care Team: ?Margaretann Loveless, MD as PCP - General (Internal Medicine) ? ?REFERRING PROVIDER: ?Margaretann Loveless, MD  ?CHIEF COMPLAINTS/REASON FOR VISIT:  ? iron deficiency anemia. ?HISTORY OF PRESENTING ILLNESS:  ? ?Sonya Finley is a  36 y.o.  female with PMH listed below was seen in consultation at the request of  Margaretann Loveless, MD  for evaluation of iron deficiency anemia ?Reviewed blood work patient had done with primary care provider ? ?03/29/2021, creatinine 0.72, WBC 6.3, hemoglobin 9.2, MCV 74, hematocrit 30.6, platelet 360 normal differential.  Urine negative for occult blood.  Iron saturation 3, ferritin 7, TIBC 427. ? ?Patient reports that her menstrual period is not heavy.  Usually 4 to 5 days. ?Patient has family history of Crohn's disease.  Patient uses ibuprofen 1-2 times daily. ?Reports feeling fatigued and tired.  No pica symptoms.  She denies any bloody bowel movement or melena.  Denies any epigastric discomfort. ? ?INTERVAL HISTORY ?Sonya Finley is a 36 y.o. female who has above history reviewed by me today presents for follow up visit for management of iron deficiency anemia. ?Fatigue has improved ?She has had IV Venofer treatments and tolerated well. ? ? ?Review of Systems  ?Constitutional:  Positive for fatigue. Negative for appetite change, chills and fever.  ?HENT:   Negative for hearing loss and voice change.   ?Eyes:  Negative for eye problems.  ?Respiratory:  Negative for chest tightness and cough.   ?Cardiovascular:  Negative for chest pain.  ?Gastrointestinal:  Negative for abdominal distention, abdominal pain and blood in stool.  ?Endocrine: Negative for hot flashes.  ?Genitourinary:  Negative for difficulty urinating and frequency.   ?Musculoskeletal:  Negative for arthralgias.  ?Skin:  Negative for itching and rash.  ?Neurological:  Negative for extremity weakness.   ?Hematological:  Negative for adenopathy.  ?Psychiatric/Behavioral:  Negative for confusion.   ? ?MEDICAL HISTORY:  ?Past Medical History:  ?Diagnosis Date  ? Frequent headaches   ? GERD (gastroesophageal reflux disease)   ? 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 History of migraine more as teenager  ? History of pyelonephritis 2013  ? History of UTI   ? last 2015  ? Hx of viral meningitis ~1995/96  ? hospitalized for 1 wk  ? Seasonal allergies   ? ? ?SURGICAL HISTORY: ?Past Surgical History:  ?Procedure Laterality Date  ? TONSILLECTOMY  ~1994/95  ? ? ?SOCIAL HISTORY: ?Social History  ? ?Socioeconomic History  ? Marital status: Single  ?  Spouse name: Not on file  ? Number of children: Not on file  ? Years of education: Not on file  ? Highest education level: Not on file  ?Occupational History  ? Not on file  ?Tobacco Use  ? Smoking status: Every Day  ?  Packs/day: 1.00  ?  Types: Cigarettes  ?  Start date: 06/05/2002  ? Smokeless tobacco: Never  ?Vaping Use  ? Vaping Use: Never used  ?Substance and Sexual Activity  ? Alcohol use: No  ?  Alcohol/week: 0.0 standard drinks  ? Drug use: No  ?  Comment: h/o heroine use  ? Sexual activity: Yes  ?  Birth control/protection: None  ?Other Topics Concern  ? Not on file  ?Social History Narrative  ? Lives with mom and boyfriend, 1 dog, 6 cats  ? 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[employer]  ?  Activity: no regular exercise - remodeling house  ? Diet: some water, vegetables regularly  ? ?Social Determinants of Health  ? ?Financial Resource Strain: Not on file  ?Food Insecurity: Not on file  ?Transportation Needs: Not on file  ?Physical Activity: Not on file  ?Stress: Not on file  ?Social Connections: Not on file  ?Intimate Partner Violence: Not on file  ? ? ?FAMILY HISTORY: ?Family History  ?Problem Relation Age of Onset  ? Crohn's disease Mother   ? Crohn's disease Maternal Aunt   ? CAD Maternal Grandmother   ? Alcohol abuse Father   ? Drug abuse  Father   ? Cancer Neg Hx   ? Diabetes Neg Hx   ? ? ?ALLERGIES:  has No Known Allergies. ? ?MEDICATIONS:  ?Current Outpatient Medications  ?Medication Sig Dispense Refill  ? escitalopram (LEXAPRO) 10 MG tablet Lexapro 10 MG Oral Tablet    ? METHADONE HCL PO Take 113 mg by mouth daily.     ? Vitamin D, Ergocalciferol, (DRISDOL) 1.25 MG (50000 UNIT) CAPS capsule Take 50,000 Units by mouth once a week. 1 Capsule(s) By Mouth Once a Week    ? amoxicillin (AMOXIL) 500 MG capsule Take 1 capsule (500 mg total) by mouth 3 (three) times daily. (Patient not taking: Reported on 04/22/2020) 30 capsule 0  ? ibuprofen (ADVIL) 600 MG tablet Take 1 tablet (600 mg total) by mouth every 8 (eight) hours as needed. 30 tablet 0  ? traMADol (ULTRAM) 50 MG tablet Take 1 tablet (50 mg total) by mouth every 6 (six) hours as needed for moderate pain. (Patient not taking: Reported on 04/22/2020) 12 tablet 0  ? ?No current facility-administered medications for this visit.  ? ? ? ?PHYSICAL EXAMINATION: ?ECOG PERFORMANCE STATUS: 0 - Asymptomatic ?Vitals:  ? 08/03/21 1416  ?BP: 100/64  ?Pulse: 89  ?Temp: (!) 97.5 ?F (36.4 ?C)  ? ?Filed Weights  ? 08/03/21 1416  ?Weight: 109 lb (49.4 kg)  ? ?Physical Exam ?Constitutional:   ?   Appearance: Normal appearance.  ?HENT:  ?   Head: Normocephalic and atraumatic.  ?Eyes:  ?   General: No scleral icterus. ?Cardiovascular:  ?   Rate and Rhythm: Normal rate.  ?Pulmonary:  ?   Effort: Pulmonary effort is normal. No respiratory distress.  ?Abdominal:  ?   General: There is no distension.  ?Musculoskeletal:     ?   General: Normal range of motion.  ?Skin: ?   Findings: No rash.  ?Neurological:  ?   General: No focal deficit present.  ?   Mental Status: She is alert and oriented to person, place, and time.  ?Psychiatric:     ?   Mood and Affect: Mood normal.  ? ? ? ?LABORATORY DATA:  ?I have reviewed the data as listed ?Lab Results  ?Component Value Date  ? WBC 7.7 08/03/2021  ? HGB 12.9 08/03/2021  ? HCT 40.5  08/03/2021  ? MCV 88.2 08/03/2021  ? PLT 268 08/03/2021  ? ?Recent Labs  ?  05/05/21 ?1610 08/03/21 ?1328  ?NA 135 135  ?K 3.9 4.0  ?CL 102 104  ?CO2 23 28  ?GLUCOSE 86 95  ?BUN <5* 9  ?CREATININE 0.66 0.75  ?CALCIUM 8.8* 8.7*  ?GFRNONAA >60 >60  ?PROT 7.9 7.2  ?ALBUMIN 4.0 3.8  ?AST 27 29  ?ALT 19 32  ?ALKPHOS 69 53  ?BILITOT 0.7 0.1*  ? ? ?Iron/TIBC/Ferritin/ %Sat ?   ?Component Value Date/Time  ? IRON 78 08/03/2021  1328  ? TIBC 421 08/03/2021 1328  ? FERRITIN 28 08/03/2021 1328  ? IRONPCTSAT 19 08/03/2021 1328  ? ?  ? ? ?RADIOGRAPHIC STUDIES: ?I have personally reviewed the radiological images as listed and agreed with the findings in the report. ?No results found. ? ? ? ?ASSESSMENT & PLAN:  ?1. Iron deficiency anemia, unspecified iron deficiency anemia type   ?2. B12 deficiency   ?Anemia, iron deficiency ?Labs reviewed and are discussed with patient.  Hemoglobin has completely normalized.   ?Iron panel resulted after patient's encounter. ?Iron saturation increased to 19, ferritin increased to 28. ?Hold IV Venofer at this point. ?Etiology of the iron deficiency is unclear.  No menorrhagia.  Chronic NSAID use which she has decreased use. ?She plans to discuss with primary care provider for GI referral.  Check celiac panel. ? ?Borderline vitamin B12.  Continue oral vitamin B12 supplementation.  We will check B12 at next visit. ? ? ? ?Orders Placed This Encounter  ?Procedures  ? CBC with Differential/Platelet  ?  Standing Status:   Future  ?  Standing Expiration Date:   02/03/2022  ? Comprehensive metabolic panel  ?  Standing Status:   Future  ?  Standing Expiration Date:   02/03/2022  ? Ferritin  ?  Standing Status:   Future  ?  Standing Expiration Date:   02/03/2022  ? Iron and TIBC  ?  Standing Status:   Future  ?  Standing Expiration Date:   02/03/2022  ?  ?All questions were answered. The patient knows to call the clinic with any problems questions or concerns. ? ?cc ?Margaretann Loveless, MD  ? ? ?Return of visit: 6  months. ? ?Rickard Patience, MD, PhD ? ?08/03/2021 ? ? ?

## 2021-10-27 ENCOUNTER — Encounter: Payer: Self-pay | Admitting: Family

## 2021-11-03 ENCOUNTER — Ambulatory Visit: Payer: 59 | Admitting: Obstetrics

## 2021-11-03 ENCOUNTER — Encounter: Payer: Self-pay | Admitting: Obstetrics

## 2021-11-03 VITALS — BP 102/74 | Ht 59.0 in | Wt 112.0 lb

## 2021-11-03 DIAGNOSIS — Z30013 Encounter for initial prescription of injectable contraceptive: Secondary | ICD-10-CM

## 2021-11-03 DIAGNOSIS — N898 Other specified noninflammatory disorders of vagina: Secondary | ICD-10-CM

## 2021-11-03 DIAGNOSIS — Z113 Encounter for screening for infections with a predominantly sexual mode of transmission: Secondary | ICD-10-CM

## 2021-11-03 LAB — POCT URINE PREGNANCY: Preg Test, Ur: NEGATIVE

## 2021-11-03 MED ORDER — MEDROXYPROGESTERONE ACETATE 150 MG/ML IM SUSP
150.0000 mg | Freq: Once | INTRAMUSCULAR | Status: AC
  Administered 2021-11-03: 150 mg via INTRAMUSCULAR

## 2021-11-03 MED ORDER — MEDROXYPROGESTERONE ACETATE 150 MG/ML IM SUSP: 150.0000 mg | INTRAMUSCULAR | 2 refills | Status: DC

## 2021-11-03 NOTE — Patient Instructions (Signed)
Have a great year! Please call with any concerns. Don't forget to wear your seatbelt everyday! If you are not signed up on MyChart, please ask [place] how to sign up for it!   In a world where you can be anything, please be kind.   Body mass index is 22.62 kg/m.  A Healthy Lifestyle: Care Instructions Your Care Instructions  A healthy lifestyle can help you feel good, stay at a healthy weight, and have plenty of energy for both work and play. A healthy lifestyle is something you can share with your whole family. A healthy lifestyle also can lower your risk for serious health problems, such as high blood pressure, heart disease, and diabetes. You can follow a few steps listed below to improve your health and the health of your family. Follow-up care is a key part of your treatment and safety. Be sure to make and go to all appointments, and call your doctor if you are having problems. It's also a good idea to know your test results and keep a list of the medicines you take. How can you care for yourself at home? Do not eat too much sugar, fat, or fast foods. You can still have dessert and treats now and then. The goal is moderation. Start small to improve your eating habits. Pay attention to portion sizes, drink less juice and soda pop, and eat more fruits and vegetables. Eat a healthy amount of food. A 3-ounce serving of meat, for example, is about the size of a deck of cards. Fill the rest of your plate with vegetables and whole grains. Limit the amount of soda and sports drinks you have every day. Drink more water when you are thirsty. Eat at least 5 servings of fruits and vegetables every day. It may seem like a lot, but it is not hard to reach this goal. A serving or helping is 1 piece of fruit, 1 cup of vegetables, or 2 cups of leafy, raw vegetables. Have an apple or some carrot sticks as an afternoon snack instead of a candy bar. Try to have fruits and/or vegetables at every meal. Make exercise  part of your daily routine. You may want to start with simple activities, such as walking, bicycling, or slow swimming. Try to be active 30 to 60 minutes every day. You do not need to do all 30 to 60 minutes all at once. For example, you can exercise 3 times a day for 10 or 20 minutes. Moderate exercise is safe for most people, but it is always a good idea to talk to your doctor before starting an exercise program. Keep moving. Mow the lawn, work in the garden, or [employer]. Take the stairs instead of the elevator at work. If you smoke, quit. People who smoke have an increased risk for heart attack, stroke, cancer, and other lung illnesses. Quitting is hard, but there are ways to boost your chance of quitting tobacco for good. Use nicotine gum, patches, or lozenges. Ask your doctor about stop-smoking programs and medicines. Keep trying. In addition to reducing your risk of diseases in the future, you will notice some benefits soon after you stop using tobacco. If you have shortness of breath or asthma symptoms, they will likely get better within a few weeks after you quit. Limit how much alcohol you drink. Moderate amounts of alcohol (up to 2 drinks a day for men, 1 drink a day for women) are okay. But drinking too much can lead to  liver problems, high blood pressure, and other health problems. Family health If you have a family, there are many things you can do together to improve your health. Eat meals together as a family as often as possible. Eat healthy foods. This includes fruits, vegetables, lean meats and dairy, and whole grains. Include your family in your fitness plan. Most people think of activities such as jogging or tennis as the way to fitness, but there are many ways you and your family can be more active. Anything that makes you breathe hard and gets your heart pumping is exercise. Here are some tips: Walk to do errands or to take your child to school or the bus. Go for a family  bike ride after dinner instead of watching TV. Care instructions adapted under license by your healthcare professional. This care instruction is for use with your licensed healthcare professional. If you have questions about a medical condition or this instruction, always ask your healthcare professional. [place] any warranty or liability for your use of this information.

## 2021-11-03 NOTE — Progress Notes (Signed)
Chief Complaint  Patient presents with   Contraception    Currently not on birth control, interested in depo   STD testing    Discharge, sour odor   Patient Sonya Finley is an 36 y.o. year old G0P0000 Patient's last menstrual period was 10/10/2021 (exact date). currently condoms for contraception who presents for desire for contraception annual. Patient is not exercising regularly but is active. Patient does perform self breast exam. Patient denies family history of genetic cancer. Patient takes no multivitamin. Patient is interested in depo provera for birth control method. Has been on OCPS in the past. Patient is sexually active with no problems.   Patients pronouns are she her. Complains of discharge with odor.   Primary care provider: Perrin Maltese, MD  Review of Systems  Constitutional:  Positive for fatigue. Negative for activity change, appetite change, chills, diaphoresis, fever and unexpected weight change.  HENT:  Positive for sneezing. Negative for congestion, dental problem, drooling, ear discharge, ear pain, facial swelling, hearing loss, mouth sores, nosebleeds, postnasal drip, rhinorrhea, sinus pressure, sinus pain, sore throat, tinnitus, trouble swallowing and voice change.   Eyes:  Negative for photophobia, pain, discharge, redness, itching and visual disturbance.  Respiratory:  Negative for apnea, cough, choking, chest tightness, shortness of breath, wheezing and stridor.   Cardiovascular:  Negative for chest pain, palpitations and leg swelling.  Gastrointestinal:  Negative for abdominal distention, abdominal pain, anal bleeding, blood in stool, constipation, diarrhea, nausea, rectal pain and vomiting.  Endocrine: Negative for cold intolerance, heat intolerance, polydipsia, polyphagia and polyuria.  Genitourinary:  Negative for decreased urine volume, difficulty urinating, dyspareunia, dysuria, enuresis, flank pain, frequency, genital sores, hematuria, menstrual  problem, pelvic pain, urgency, vaginal bleeding, vaginal discharge and vaginal pain.  Musculoskeletal:  Negative for arthralgias, back pain, gait problem, joint swelling, myalgias, neck pain and neck stiffness.  Skin:  Negative for color change, pallor, rash and wound.  Allergic/Immunologic: Positive for environmental allergies. Negative for food allergies and immunocompromised state.  Neurological:  Negative for dizziness, tremors, seizures, syncope, facial asymmetry, speech difficulty, weakness, light-headedness, numbness and headaches.  Hematological:  Negative for adenopathy. Does not bruise/bleed easily.  Psychiatric/Behavioral:  Negative for agitation, behavioral problems, confusion, decreased concentration, dysphoric mood, hallucinations, self-injury, sleep disturbance and suicidal ideas. The patient is not nervous/anxious and is not hyperactive.     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 Patient denies pelvic infections. Patient denies domestic violence or sexual abuse Patient has not received Gardisil series  Health  Maintenance  Topic Date Due   COVID-19 Vaccine (1) Never done   HIV Screening  Never done   Hepatitis C Screening  Never done   TETANUS/TDAP  Never done   INFLUENZA VACCINE  01/03/2022   PAP SMEAR-Modifier  04/23/2023   HPV VACCINES  Aged Out    Medicine list and allergies reviewed and updated.     Objective:  BP 102/74   Ht 4\' 11"  (1.499 m)   Wt 112 lb (50.8 kg)   LMP 10/10/2021 (Exact Date)   BMI 22.62 kg/m      View : No data to display.             View : No data to display.          Physical Exam Vitals and nursing note reviewed. Exam conducted with a chaperone present.  Constitutional:      Appearance: Normal appearance.  HENT:     Head: Normocephalic and atraumatic.  Eyes:     Extraocular Movements: Extraocular movements intact.  Cardiovascular:     Rate and Rhythm: Normal rate and regular rhythm.     Heart sounds: Normal heart sounds.  Pulmonary:     Effort: Pulmonary effort is normal.     Breath sounds: Normal breath sounds.  Abdominal:     General: There is no distension.     Palpations: There is no mass.     Tenderness: There is no abdominal tenderness. There is no guarding.     Hernia: No hernia is present. There is no hernia in the left inguinal area or right inguinal area.  Genitourinary:    General: Normal vulva.     Exam position: Lithotomy position.     Pubic Area: No rash.      Labia:        Right: No lesion.        Left: No lesion.      Vagina: Vaginal discharge present. No bleeding or lesions.     Cervix: Discharge present. No lesion or cervical bleeding.     Uterus: Normal. Not enlarged, not tender and no uterine prolapse.      Adnexa: Right adnexa normal.       Right: No tenderness or fullness.         Left: No tenderness or fullness.    Musculoskeletal:        General: Normal range of motion.     Cervical back: Normal range of motion.  Lymphadenopathy:     Lower Body: No right inguinal adenopathy. No left inguinal adenopathy.   Skin:    General: Skin is warm and dry.  Neurological:     General: No focal deficit present.     Mental Status: She is oriented to person, place, and time.     Coordination: Coordination normal.     Deep Tendon Reflexes: Reflexes normal.  Psychiatric:        Mood and Affect: Mood normal.        Behavior: Behavior normal.        Thought Content: Thought content normal.    Assessment/Plan:   Initiation of Depo Provera - Plan: POCT urine pregnancy, medroxyPROGESTERone (DEPO-PROVERA) injection 150 mg, medroxyPROGESTERone (DEPO-PROVERA) 150 MG/ML injection  Vaginal discharge - Plan: NuSwab Vaginitis Plus (VG+)  Routine screening for STI (sexually transmitted infection)  Return for annual pap Cultures obtained with STI testing; will await results for treatment Declines blood work STI testing  Monthly self breast exam. Daily multivitamin recommended. Contraception: desires depo  today, given. Follow up in 3 mo for next injection  Gardisil discussed declined  Return in about 3 months (around 02/03/2022), or if symptoms worsen or fail to improve, for annual/well woman with depo follow up .

## 2021-11-06 LAB — NUSWAB VAGINITIS PLUS (VG+)
Candida albicans, NAA: NEGATIVE
Candida glabrata, NAA: NEGATIVE
Chlamydia trachomatis, NAA: NEGATIVE
Neisseria gonorrhoeae, NAA: NEGATIVE
Trich vag by NAA: POSITIVE — AB

## 2021-11-09 MED ORDER — METRONIDAZOLE 500 MG PO TABS: 500.0000 mg | ORAL_TABLET | Freq: Two times a day (BID) | ORAL | 0 refills | Status: DC

## 2021-11-09 NOTE — Progress Notes (Signed)
Called pt left voice message for her to return call.

## 2021-11-09 NOTE — Addendum Note (Signed)
Addended by: Althea Charon on: 11/09/2021 11:08 AM   Modules accepted: Orders

## 2021-11-10 ENCOUNTER — Telehealth: Payer: Self-pay | Admitting: Obstetrics

## 2021-11-10 NOTE — Telephone Encounter (Signed)
Patient is returning missed call. Please advise 

## 2021-11-10 NOTE — Telephone Encounter (Signed)
See result notes. 

## 2021-11-10 NOTE — Progress Notes (Signed)
Called pt again today to advise of lab results. No answer,left a voice message to return call

## 2021-11-11 NOTE — Telephone Encounter (Signed)
Pt aware of results and medication at pharmacy

## 2021-11-11 NOTE — Progress Notes (Signed)
Pt aware of results 

## 2022-02-02 ENCOUNTER — Other Ambulatory Visit (HOSPITAL_COMMUNITY)
Admission: RE | Admit: 2022-02-02 | Discharge: 2022-02-02 | Disposition: A | Discharge status: Home / Self Care | Payer: 59 | Source: Ambulatory Visit | Attending: Obstetrics & Gynecology | Admitting: Obstetrics & Gynecology

## 2022-02-02 ENCOUNTER — Ambulatory Visit (INDEPENDENT_AMBULATORY_CARE_PROVIDER_SITE_OTHER): Payer: 59 | Admitting: Obstetrics & Gynecology

## 2022-02-02 VITALS — BP 100/50 | HR 85 | Resp 16 | Ht 59.0 in | Wt 108.0 lb

## 2022-02-02 DIAGNOSIS — Z124 Encounter for screening for malignant neoplasm of cervix: Secondary | ICD-10-CM | POA: Diagnosis present

## 2022-02-02 DIAGNOSIS — Z3042 Encounter for surveillance of injectable contraceptive: Secondary | ICD-10-CM | POA: Diagnosis not present

## 2022-02-02 DIAGNOSIS — Z01419 Encounter for gynecological examination (general) (routine) without abnormal findings: Secondary | ICD-10-CM

## 2022-02-02 MED ORDER — MEDROXYPROGESTERONE ACETATE 150 MG/ML IM SUSP
150.0000 mg | Freq: Once | INTRAMUSCULAR | Status: AC
  Administered 2022-02-02: 150 mg via INTRAMUSCULAR

## 2022-02-02 NOTE — Progress Notes (Signed)
nSubjective:     Sonya Finley is a 36 y.o.G0 P0 female here for a routine exam.  Current complaints: none.  Personal health questionnaire reviewed: yes.   Gynecologic History Patient's last menstrual period was 01/25/2022 (exact date). Contraception: Depo-Provera injections Last Pap: 2021. Results were: normal Obstetric History OB History  Gravida Para Term Preterm AB Living  0 0 0 0 0 0  SAB IAB Ectopic Multiple Live Births  0 0 0 0 0      The following portions of the patient's history were reviewed and updated as appropriate: allergies, current medications, past family history, past medical history, past social history, past surgical history, and problem list. Review of Systems A comprehensive review of systems was negative.    Objective:    Breasts: normal appearance, no masses or tenderness Abdomen: normal findings: no organomegaly and soft, non-tender Pelvic: cervix normal in appearance, external genitalia normal, no adnexal masses or tenderness, no cervical motion tenderness, uterus normal size, shape, and consistency, and vagina normal without discharge Extremities: extremities normal, atraumatic, no cyanosis or edema Skin: normal and mobility and turgor normal    Assessment:    GYN annual Contraception with Depo Provera  Healthy female exam.    Plan:    Contraception: Depo-Provera injections. Pap done, STD screening declined    RTO in 1 year for pelvic and prn  Linzie Collin, MD  02/02/2022 9:03 AM

## 2022-02-03 ENCOUNTER — Ambulatory Visit: Payer: 59

## 2022-02-07 ENCOUNTER — Inpatient Hospital Stay: Payer: 59 | Attending: Oncology

## 2022-02-07 ENCOUNTER — Inpatient Hospital Stay (HOSPITAL_BASED_OUTPATIENT_CLINIC_OR_DEPARTMENT_OTHER): Payer: 59 | Admitting: Oncology

## 2022-02-07 ENCOUNTER — Encounter: Payer: Self-pay | Admitting: Oncology

## 2022-02-07 VITALS — BP 113/70 | HR 88 | Temp 98.6°F | Resp 17 | Wt 110.0 lb

## 2022-02-07 DIAGNOSIS — D509 Iron deficiency anemia, unspecified: Secondary | ICD-10-CM

## 2022-02-07 DIAGNOSIS — E538 Deficiency of other specified B group vitamins: Secondary | ICD-10-CM | POA: Diagnosis not present

## 2022-02-07 LAB — FERRITIN: Ferritin: 22 ng/mL (ref 11–307)

## 2022-02-07 LAB — CBC WITH DIFFERENTIAL/PLATELET
Abs Immature Granulocytes: 0.02 10*3/uL (ref 0.00–0.07)
Basophils Absolute: 0.1 10*3/uL (ref 0.0–0.1)
Basophils Relative: 1 %
Eosinophils Absolute: 0.1 10*3/uL (ref 0.0–0.5)
Eosinophils Relative: 1 %
HCT: 38.8 % (ref 36.0–46.0)
Hemoglobin: 13.1 g/dL (ref 12.0–15.0)
Immature Granulocytes: 0 %
Lymphocytes Relative: 35 %
Lymphs Abs: 3 10*3/uL (ref 0.7–4.0)
MCH: 30.5 pg (ref 26.0–34.0)
MCHC: 33.8 g/dL (ref 30.0–36.0)
MCV: 90.4 fL (ref 80.0–100.0)
Monocytes Absolute: 0.5 10*3/uL (ref 0.1–1.0)
Monocytes Relative: 6 %
Neutro Abs: 4.9 10*3/uL (ref 1.7–7.7)
Neutrophils Relative %: 57 %
Platelets: 281 10*3/uL (ref 150–400)
RBC: 4.29 MIL/uL (ref 3.87–5.11)
RDW: 12.5 % (ref 11.5–15.5)
WBC: 8.6 10*3/uL (ref 4.0–10.5)
nRBC: 0 % (ref 0.0–0.2)

## 2022-02-07 LAB — IRON AND TIBC
Iron: 131 ug/dL (ref 28–170)
Saturation Ratios: 32 % — ABNORMAL HIGH (ref 10.4–31.8)
TIBC: 406 ug/dL (ref 250–450)
UIBC: 275 ug/dL

## 2022-02-07 LAB — VITAMIN B12: Vitamin B-12: 216 pg/mL (ref 180–914)

## 2022-02-07 NOTE — Progress Notes (Signed)
Hematology/Oncology Progress note [phone] [phone]      Patient Care Team: Margaretann Loveless, MD as PCP - General (Internal Medicine)  REFERRING PROVIDER: Margaretann Loveless, MD  CHIEF COMPLAINTS/REASON FOR VISIT:   iron deficiency anemia. HISTORY OF PRESENTING ILLNESS:   Sonya Finley is a  36 y.o.  female with PMH listed below was seen in consultation at the request of  Margaretann Loveless, MD  for evaluation of iron deficiency anemia Reviewed blood work patient had done with primary care provider  03/29/2021, creatinine 0.72, WBC 6.3, hemoglobin 9.2, MCV 74, hematocrit 30.6, platelet 360 normal differential.  Urine negative for occult blood.  Iron saturation 3, ferritin 7, TIBC 427.  Patient reports that her menstrual period is not heavy.  Usually 4 to 5 days. Patient has family history of Crohn's disease.  Patient uses ibuprofen 1-2 times daily. Reports feeling fatigued and tired.  No pica symptoms.  She denies any bloody bowel movement or melena.  Denies any epigastric discomfort.  INTERVAL HISTORY Sonya Finley is a 36 y.o. female who has above history reviewed by me today presents for follow up visit for management of iron deficiency anemia. Fatigue has improved and she feels well.  Occasional NSAID use.  Review of Systems  Constitutional:  Negative for appetite change, chills, fatigue and fever.  HENT:   Negative for hearing loss and voice change.   Eyes:  Negative for eye problems.  Respiratory:  Negative for chest tightness and cough.   Cardiovascular:  Negative for chest pain.  Gastrointestinal:  Negative for abdominal distention, abdominal pain and blood in stool.  Endocrine: Negative for hot flashes.  Genitourinary:  Negative for difficulty urinating and frequency.   Musculoskeletal:  Negative for arthralgias.  Skin:  Negative for itching and rash.  Neurological:  Negative for extremity weakness.  Hematological:  Negative for  adenopathy.  Psychiatric/Behavioral:  Negative for confusion.     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, on Methadone by [facility]   History of migraine more as teenager   History of pyelonephritis 2013   History of UTI    last 2015   Hx of viral meningitis ~1995/96   hospitalized for 1 wk   Seasonal allergies     SURGICAL HISTORY: Past Surgical History:  Procedure Laterality Date   TONSILLECTOMY  ~1994/95    SOCIAL HISTORY: Social History   Socioeconomic History   Marital status: Single    Spouse name: Not on file   Number of children: Not on file   Years of education: Not on file   Highest education level: Not on file  Occupational History   Not on file  Tobacco Use   Smoking status: Former    Packs/day: 1.00    Types: Cigarettes    Start date: 06/05/2002   Smokeless tobacco: Never  Vaping Use   Vaping Use: Every day  Substance and Sexual Activity   Alcohol use: No    Alcohol/week: 0.0 standard drinks of alcohol   Drug use: No    Comment: h/o heroine use   Sexual activity: Yes    Birth control/protection: None  Other Topics Concern   Not on file  Social History Narrative   Lives with mom and boyfriend, 1 dog, 6 cats   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[employer]   Activity:  no regular exercise - remodeling house   Diet: some water, vegetables regularly   Social Determinants of Health   Financial Resource Strain: Not on file  Food Insecurity: Not on file  Transportation Needs: No Transportation Needs (04/22/2020)   PRAPARE - Administrator, Civil Service (Medical): No    Lack of Transportation (Non-Medical): No  Physical Activity: Not on file  Stress: Not on file  Social Connections: Not on file  Intimate Partner Violence: Not on file    FAMILY HISTORY: Family History  Problem Relation Age of Onset   Crohn's  disease Mother    Alcohol abuse Father    Drug abuse Father    Crohn's disease Maternal Aunt    CAD Maternal Grandmother    Cancer Neg Hx    Diabetes Neg Hx     ALLERGIES:  has No Known Allergies.  MEDICATIONS:  Current Outpatient Medications  Medication Sig Dispense Refill   escitalopram (LEXAPRO) 10 MG tablet Lexapro 10 MG Oral Tablet     medroxyPROGESTERone (DEPO-PROVERA) 150 MG/ML injection Inject 1 mL (150 mg total) into the muscle every 3 (three) months. 1 each 2   METHADONE HCL PO Take 5 mg by mouth daily.     metroNIDAZOLE (FLAGYL) 500 MG tablet Take 1 tablet (500 mg total) by mouth 2 (two) times daily. 14 tablet 0   omeprazole (PRILOSEC) 40 MG capsule Take 40 mg by mouth daily.     Vitamin D, Ergocalciferol, (DRISDOL) 1.25 MG (50000 UNIT) CAPS capsule Take 50,000 Units by mouth once a week. 1 Capsule(s) By Mouth Once a Week     amoxicillin-clavulanate (AUGMENTIN) 500-125 MG tablet Take 1 tablet by mouth 2 (two) times daily.     No current facility-administered medications for this visit.     PHYSICAL EXAMINATION: ECOG PERFORMANCE STATUS: 0 - Asymptomatic Vitals:   02/07/22 1339  BP: 113/70  Pulse: 88  Resp: 17  Temp: 98.6 F (37 C)  SpO2: 99%   Filed Weights   02/07/22 1339  Weight: 110 lb (49.9 kg)   Physical Exam Constitutional:      Appearance: Normal appearance.  HENT:     Head: Normocephalic and atraumatic.  Eyes:     General: No scleral icterus. Cardiovascular:     Rate and Rhythm: Normal rate.  Pulmonary:     Effort: Pulmonary effort is normal. No respiratory distress.  Abdominal:     General: There is no distension.  Musculoskeletal:        General: Normal range of motion.  Skin:    Findings: No rash.  Neurological:     General: No focal deficit present.     Mental Status: She is alert and oriented to person, place, and time.  Psychiatric:        Mood and Affect: Mood normal.      LABORATORY DATA:  I have reviewed the data as  listed Lab Results  Component Value Date   WBC 8.6 02/07/2022   HGB 13.1 02/07/2022   HCT 38.8 02/07/2022   MCV 90.4 02/07/2022   PLT 281 02/07/2022   Recent Labs    05/05/21 1610 08/03/21 1328  NA 135 135  K 3.9 4.0  CL 102 104  CO2 23 28  GLUCOSE 86 95  BUN <5* 9  CREATININE 0.66 0.75  CALCIUM 8.8* 8.7*  GFRNONAA >60 >60  PROT 7.9 7.2  ALBUMIN 4.0 3.8  AST 27 29  ALT 19 32  ALKPHOS 69 53  BILITOT 0.7 0.1*    Iron/TIBC/Ferritin/ %Sat    Component Value Date/Time   IRON 131 02/07/2022 1324   TIBC 406 02/07/2022 1324   FERRITIN 22 02/07/2022 1324   IRONPCTSAT 32 (H) 02/07/2022 1324       RADIOGRAPHIC STUDIES: I have personally reviewed the radiological images as listed and agreed with the findings in the report. No results found.    ASSESSMENT & PLAN:  1. Iron deficiency anemia, unspecified iron deficiency anemia type   2. B12 deficiency   Anemia, iron deficiency Labs reviewed and discussed with patient. Patient has normal hemoglobin level. Iron panel is normal. No need additional IV Venofer treatments.   Celiac panel is pending Patient has upcoming gastroenterology evaluation.  Borderline vitamin B12.  Continue oral vitamin B12 supplementation.  B12 level is pending.   Orders Placed This Encounter  Procedures   CBC with Differential/Platelet    Standing Status:   Future    Standing Expiration Date:   02/08/2023   Ferritin    Standing Status:   Future    Standing Expiration Date:   02/08/2023   Iron and TIBC    Standing Status:   Future    Standing Expiration Date:   02/08/2023   Vitamin B12    Standing Status:   Future    Standing Expiration Date:   02/08/2023    All questions were answered. The patient knows to call the clinic with any problems questions or concerns.  cc Perrin Maltese, MD    Return of visit: 12 months.  Earlie Server, MD, PhD  02/07/2022

## 2022-02-07 NOTE — Progress Notes (Signed)
Patient here for oncology follow-up appointment, expresses no new concerns at this time.    

## 2022-02-08 ENCOUNTER — Other Ambulatory Visit: Payer: Self-pay | Admitting: Oncology

## 2022-02-08 LAB — CELIAC PANEL 10
Antigliadin Abs, IgA: 2 units (ref 0–19)
Endomysial Ab, IgA: NEGATIVE
Gliadin IgG: 1 units (ref 0–19)
IgA: 142 mg/dL (ref 87–352)
Tissue Transglut Ab: <2 U/mL (ref 0–5)
Tissue Transglutaminase Ab, IgA: <2 U/mL (ref 0–3)

## 2022-02-08 LAB — CYTOLOGY - PAP: Diagnosis: NEGATIVE

## 2022-02-15 ENCOUNTER — Encounter: Payer: Self-pay | Admitting: Gastroenterology

## 2022-02-15 ENCOUNTER — Ambulatory Visit (INDEPENDENT_AMBULATORY_CARE_PROVIDER_SITE_OTHER): Payer: 59 | Admitting: Gastroenterology

## 2022-02-15 VITALS — BP 106/73 | HR 92 | Temp 98.4°F | Ht 59.0 in | Wt 107.0 lb

## 2022-02-15 DIAGNOSIS — E611 Iron deficiency: Secondary | ICD-10-CM | POA: Diagnosis not present

## 2022-02-15 MED ORDER — NA SULFATE-K SULFATE-MG SULF 17.5-3.13-1.6 GM/177ML PO SOLN: 1.0000 | Freq: Once | ORAL | 0 refills | Status: AC

## 2022-02-15 NOTE — Progress Notes (Signed)
Gastroenterology Consultation  Referring Provider:     Margaretann Loveless, MD Primary Care Physician:  Margaretann Loveless, MD Primary Gastroenterologist:  Dr. Servando Snare     Reason for Consultation:     Iron deficiency anemia        HPI:   Sonya Finley is a 36 y.o. y/o female referred for consultation & management of iron deficiency anemia by Dr. Welton Flakes, Collier Salina, MD. This patient comes in today after being seen by hematology who noted the patient had iron deficiency anemia with B12 deficiency noted also.  The hematology note stated that the patient did not have heavy periods and was taking NSAIDs but had cut down on the NSAIDs.  The patient was treated with iron and her iron levels and saturation had come back to normal.  Ferritin had gone from 5 to 22 on her most recent lab work. The patient denies any other source of blood loss such as hematuria.  The patient also reports that her mother has Crohn's disease.  The patient has had alternating diarrhea and constipation most of her life but no bloody stools or abdominal pain.  She also denies any unexplained weight loss.  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 Authorizing Provider  amoxicillin-clavulanate (AUGMENTIN) 500-125 MG tablet Take 1 tablet by mouth 2 (two) times daily. 10/25/21   [provider]  escitalopram (LEXAPRO) 10 MG tablet Lexapro 10 MG Oral Tablet 03/29/21   [provider]  medroxyPROGESTERone (DEPO-PROVERA) 150 MG/ML  injection Inject 1 mL (150 mg total) into the muscle every 3 (three) months. 11/03/21   Horald Pollen, MD  METHADONE HCL PO Take 5 mg by mouth daily.    [provider]  metroNIDAZOLE (FLAGYL) 500 MG tablet Take 1 tablet (500 mg total) by mouth 2 (two) times daily. 11/09/21   Horald Pollen, MD  omeprazole (PRILOSEC) 40 MG capsule Take 40 mg by mouth daily. 10/25/21   [provider]  Vitamin D, Ergocalciferol, (DRISDOL) 1.25 MG (50000 UNIT) CAPS capsule Take 50,000 Units by mouth once a week. 1 Capsule(s) By Mouth Once a Week 08/01/21   [provider]    Family History  Problem Relation Age of Onset   Crohn's disease Mother    Alcohol abuse Father    Drug abuse Father    Crohn's disease Maternal Aunt    CAD Maternal Grandmother    Cancer Neg Hx    Diabetes Neg Hx      Social History   Tobacco Use   Smoking status: Former    Packs/day: 1.00    Types: Cigarettes    Start date: 06/05/2002   Smokeless tobacco: Never  Vaping Use   Vaping Use: Every day  Substance Use Topics   Alcohol use: No    Alcohol/week: 0.0 standard drinks of alcohol   Drug use: No    Comment: h/o heroine use  Allergies as of 02/15/2022   (No Known Allergies)    Review of Systems:    All systems reviewed and negative except where noted in HPI.   Physical Exam:  LMP 01/25/2022 (Exact Date)  Patient's last menstrual period was 01/25/2022 (exact date). General:   Alert,  Well-developed, well-nourished, pleasant and cooperative in NAD Head:  Normocephalic and atraumatic. Eyes:  Sclera clear, no icterus.   Conjunctiva pink. Ears:  Normal auditory acuity. Neck:  Supple; no masses or thyromegaly. Lungs:  Respirations even and unlabored.  Clear throughout to auscultation.   No wheezes, crackles, or rhonchi. No acute distress. Heart:  Regular rate and rhythm; no murmurs, clicks, rubs, or gallops. Abdomen:  Normal bowel sounds.  No bruits.  Soft, non-tender and non-distended  without masses, hepatosplenomegaly or hernias noted.  No guarding or rebound tenderness.  Negative Carnett sign.   Rectal:  Deferred.  Pulses:  Normal pulses noted. Extremities:  No clubbing or edema.  No cyanosis. Neurologic:  Alert and oriented x3;  grossly normal neurologically. Skin:  Intact without significant lesions or rashes.  No jaundice. Lymph Nodes:  No significant cervical adenopathy. Psych:  Alert and cooperative. Normal mood and affect.  Imaging Studies: No results found.  Assessment and Plan:   Sonya Finley is a 36 y.o. y/o female who comes in today with iron deficiency anemia without a cause found anywhere.  The patient has been told that due to the iron deficiency anemia she will be set up for an EGD and colonoscopy to look for a source of the blood loss.  If the EGD and colonoscopy are negative the patient has been told that she may need to undergo a capsule endoscopy for complete evaluation of her GI tract.  The patient has been explained the plan and agrees with it.  She will follow-up at the time of the EGD and colonoscopy.    Midge Minium, MD. Clementeen Graham    Note: This dictation was prepared with Dragon dictation along with smaller phrase technology. Any transcriptional errors that result from this process are unintentional.

## 2022-02-16 NOTE — Addendum Note (Signed)
Addended by: Roena Malady on: 02/16/2022 10:31 AM   Modules accepted: Orders

## 2022-04-17 ENCOUNTER — Telehealth: Payer: Self-pay | Admitting: *Deleted

## 2022-04-17 NOTE — Telephone Encounter (Signed)
Patient called office that she could not go through with her colonoscopy right now due to expenses.  Colonoscopy 04/20/2022. It had been reschedule to 06/22/2022. New instructions will be sent for patient.  Reminded patient that Rx for Suprep will still be available at [facility] pharmacy.

## 2022-05-03 ENCOUNTER — Ambulatory Visit (INDEPENDENT_AMBULATORY_CARE_PROVIDER_SITE_OTHER): Payer: 59

## 2022-05-03 VITALS — BP 98/60 | HR 88 | Ht 59.0 in | Wt 108.7 lb

## 2022-05-03 DIAGNOSIS — Z3042 Encounter for surveillance of injectable contraceptive: Secondary | ICD-10-CM | POA: Diagnosis not present

## 2022-05-03 MED ORDER — MEDROXYPROGESTERONE ACETATE 150 MG/ML IM SUSP
150.0000 mg | Freq: Once | INTRAMUSCULAR | Status: AC
  Administered 2022-05-03: 150 mg via INTRAMUSCULAR

## 2022-05-03 NOTE — Progress Notes (Addendum)
    NURSE VISIT NOTE  Subjective:    Patient ID: Sonya Finley, female    DOB: 08/12/1985, 36 y.o.   MRN: [id number]  HPI  Patient is a 36 y.o. G0P0000 female who presents for depo provera injection.   Objective:    BP 98/60   Pulse 88   Ht 4\' 11"  (1.499 m)   Wt 108 lb 11.2 oz (49.3 kg)   BMI 21.95 kg/m    Date last pap: 02/02/22. Last Depo-Provera: 02/02/22. Side Effects if any: None. Serum HCG indicated? N/A. Depo-Provera 150 mg IM given by: 02/04/22, LPN. Site: Right Upper Outer Quadrant  Assessment:   1. Encounter for surveillance of injectable contraceptive      Plan:   Next appointment due between 07/19/22 and 08/02/22.    08/04/22, LPN

## 2022-05-04 ENCOUNTER — Ambulatory Visit: Payer: 59

## 2022-06-09 ENCOUNTER — Emergency Department
Admission: EM | Admit: 2022-06-09 | Discharge: 2022-06-09 | Discharge status: Against Medical Advice | Payer: 59 | Attending: Emergency Medicine | Admitting: Emergency Medicine

## 2022-06-09 ENCOUNTER — Other Ambulatory Visit: Payer: Self-pay

## 2022-06-09 DIAGNOSIS — Z5321 Procedure and treatment not carried out due to patient leaving prior to being seen by health care provider: Secondary | ICD-10-CM | POA: Diagnosis not present

## 2022-06-09 DIAGNOSIS — R110 Nausea: Secondary | ICD-10-CM | POA: Diagnosis not present

## 2022-06-09 DIAGNOSIS — R350 Frequency of micturition: Secondary | ICD-10-CM | POA: Insufficient documentation

## 2022-06-09 DIAGNOSIS — R197 Diarrhea, unspecified: Secondary | ICD-10-CM | POA: Insufficient documentation

## 2022-06-09 DIAGNOSIS — R109 Unspecified abdominal pain: Secondary | ICD-10-CM | POA: Diagnosis not present

## 2022-06-09 LAB — BASIC METABOLIC PANEL
Anion gap: 7 (ref 5–15)
BUN: 5 mg/dL — ABNORMAL LOW (ref 6–20)
CO2: 24 mmol/L (ref 22–32)
Calcium: 9.1 mg/dL (ref 8.9–10.3)
Chloride: 107 mmol/L (ref 98–111)
Creatinine, Ser: 0.82 mg/dL (ref 0.44–1.00)
GFR, Estimated: >60 mL/min (ref >60)
Glucose, Bld: 90 mg/dL (ref 70–99)
Potassium: 4.4 mmol/L (ref 3.5–5.1)
Sodium: 138 mmol/L (ref 135–145)

## 2022-06-09 LAB — CBC
HCT: 40.1 % (ref 36.0–46.0)
Hemoglobin: 13.4 g/dL (ref 12.0–15.0)
MCH: 30.2 pg (ref 26.0–34.0)
MCHC: 33.4 g/dL (ref 30.0–36.0)
MCV: 90.5 fL (ref 80.0–100.0)
Platelets: 292 10*3/uL (ref 150–400)
RBC: 4.43 MIL/uL (ref 3.87–5.11)
RDW: 11.9 % (ref 11.5–15.5)
WBC: 7.3 10*3/uL (ref 4.0–10.5)
nRBC: 0 % (ref 0.0–0.2)

## 2022-06-09 LAB — URINALYSIS, ROUTINE W REFLEX MICROSCOPIC
Bilirubin Urine: NEGATIVE
Glucose, UA: NEGATIVE mg/dL
Ketones, ur: NEGATIVE mg/dL
Leukocytes,Ua: NEGATIVE
Nitrite: NEGATIVE
Protein, ur: NEGATIVE mg/dL
Specific Gravity, Urine: 1.003 — ABNORMAL LOW (ref 1.005–1.030)
pH: 7 (ref 5.0–8.0)

## 2022-06-09 NOTE — ED Provider Triage Note (Signed)
  Emergency Medicine Provider Triage Evaluation Note  Sonya Finley , a 37 y.o.female,  was evaluated in triage.  Pt complains of right-sided flank pain x 3 days.  Additionally endorses nausea and diarrhea as well.  She states that she has not been urinating as frequently as usual.  She states that she has had kidney infections in the past.   Review of Systems  Positive: Right-sided flank pain, nausea, diarrhea Negative: Denies fever, chest pain, vomiting  Physical Exam   Vitals:   06/09/22 1809  BP: (!) 117/92  Pulse: 95  Temp: 98.4 F (36.9 C)  SpO2: 98%   Gen:   Awake, no distress   Resp:  Normal effort  MSK:   Moves extremities without difficulty  Other:    Medical Decision Making  Given the patient's initial medical screening exam, the following diagnostic evaluation has been ordered. The patient will be placed in the appropriate treatment space, once one is available, to complete the evaluation and treatment. I have discussed the plan of care with the patient and I have advised the patient that an ED physician or mid-level practitioner will reevaluate their condition after the test results have been received, as the results may give them additional insight into the type of treatment they may need.    Diagnostics: Labs, UA,  Treatments: none immediately   Teodoro Spray, [place] 06/09/22 1813

## 2022-06-09 NOTE — ED Triage Notes (Signed)
Pt comes with c/o right flank pain for few days.

## 2022-06-21 ENCOUNTER — Encounter: Payer: Self-pay | Admitting: Gastroenterology

## 2022-06-22 ENCOUNTER — Encounter: Payer: Self-pay | Admitting: Gastroenterology

## 2022-06-22 ENCOUNTER — Ambulatory Visit
Admission: RE | Admit: 2022-06-22 | Discharge: 2022-06-22 | Disposition: A | Discharge status: Home / Self Care | Payer: 59 | Attending: Gastroenterology | Admitting: Gastroenterology

## 2022-06-22 ENCOUNTER — Encounter
Admission: RE | Disposition: A | Discharge status: Home / Self Care | Payer: Self-pay | Source: Home / Self Care | Attending: Gastroenterology

## 2022-06-22 ENCOUNTER — Ambulatory Visit: Payer: 59 | Admitting: Anesthesiology

## 2022-06-22 DIAGNOSIS — K319 Disease of stomach and duodenum, unspecified: Secondary | ICD-10-CM | POA: Insufficient documentation

## 2022-06-22 DIAGNOSIS — K219 Gastro-esophageal reflux disease without esophagitis: Secondary | ICD-10-CM | POA: Insufficient documentation

## 2022-06-22 DIAGNOSIS — K259 Gastric ulcer, unspecified as acute or chronic, without hemorrhage or perforation: Secondary | ICD-10-CM | POA: Diagnosis not present

## 2022-06-22 DIAGNOSIS — D500 Iron deficiency anemia secondary to blood loss (chronic): Secondary | ICD-10-CM

## 2022-06-22 DIAGNOSIS — K253 Acute gastric ulcer without hemorrhage or perforation: Secondary | ICD-10-CM | POA: Diagnosis not present

## 2022-06-22 DIAGNOSIS — F1120 Opioid dependence, uncomplicated: Secondary | ICD-10-CM | POA: Insufficient documentation

## 2022-06-22 DIAGNOSIS — D509 Iron deficiency anemia, unspecified: Secondary | ICD-10-CM | POA: Diagnosis present

## 2022-06-22 DIAGNOSIS — E611 Iron deficiency: Secondary | ICD-10-CM

## 2022-06-22 HISTORY — PX: COLONOSCOPY WITH PROPOFOL: SHX5780

## 2022-06-22 HISTORY — PX: ESOPHAGOGASTRODUODENOSCOPY: SHX5428

## 2022-06-22 LAB — POCT PREGNANCY, URINE: Preg Test, Ur: NEGATIVE

## 2022-06-22 SURGERY — COLONOSCOPY WITH PROPOFOL
Anesthesia: General

## 2022-06-22 MED ORDER — PROPOFOL 500 MG/50ML IV EMUL
INTRAVENOUS | Status: DC | PRN
  Administered 2022-06-22: 150 ug/kg/min via INTRAVENOUS

## 2022-06-22 MED ORDER — LIDOCAINE HCL (CARDIAC) PF 100 MG/5ML IV SOSY
PREFILLED_SYRINGE | INTRAVENOUS | Status: DC | PRN
  Administered 2022-06-22: 50 mg via INTRAVENOUS

## 2022-06-22 MED ORDER — SODIUM CHLORIDE 0.9 % IV SOLN: INTRAVENOUS | Status: DC

## 2022-06-22 MED ORDER — PROPOFOL 10 MG/ML IV BOLUS
INTRAVENOUS | Status: DC | PRN
  Administered 2022-06-22: 100 mg via INTRAVENOUS

## 2022-06-22 MED ORDER — DEXMEDETOMIDINE HCL IN NACL 200 MCG/50ML IV SOLN
INTRAVENOUS | Status: DC | PRN
  Administered 2022-06-22: 12 ug via INTRAVENOUS
  Administered 2022-06-22: 8 ug via INTRAVENOUS

## 2022-06-22 NOTE — Anesthesia Postprocedure Evaluation (Signed)
Anesthesia Post Note  Patient: Sonya Finley  Procedure(s) Performed: COLONOSCOPY WITH PROPOFOL ESOPHAGOGASTRODUODENOSCOPY (EGD)  Patient location during evaluation: PACU Anesthesia Type: General Level of consciousness: awake Pain management: satisfactory to patient Vital Signs Assessment: post-procedure vital signs reviewed and stable Respiratory status: spontaneous breathing and nonlabored ventilation Cardiovascular status: stable Anesthetic complications: no  No notable events documented.   Last Vitals:  Vitals:   06/22/22 0920 06/22/22 0930  BP: (!) 94/58   Pulse:    Resp:    Temp:    SpO2:  100%    Last Pain:  Vitals:   06/22/22 0930  TempSrc:   PainSc: 0-No pain                 VAN STAVEREN,Ascher Schroepfer

## 2022-06-22 NOTE — Anesthesia Preprocedure Evaluation (Signed)
Anesthesia Evaluation  Patient identified by MRN, date of birth, ID band Patient awake    Reviewed: Allergy & Precautions, NPO status , Patient's Chart, lab work & pertinent test results  Airway Mallampati: II  TM Distance: >3 FB Neck ROM: full    Dental  (+) Poor Dentition, Chipped, Missing   Pulmonary neg pulmonary ROS, Patient abstained from smoking., former smoker   Pulmonary exam normal breath sounds clear to auscultation       Cardiovascular Exercise Tolerance: Good negative cardio ROS Normal cardiovascular exam Rhythm:Regular Rate:Normal     Neuro/Psych negative neurological ROS  negative psych ROS   GI/Hepatic negative GI ROS, Neg liver ROS,GERD  Medicated,,(+)     substance abuse    Endo/Other  negative endocrine ROS    Renal/GU negative Renal ROS  negative genitourinary   Musculoskeletal  (+)  narcotic dependent  Abdominal Normal abdominal exam  (+)   Peds negative pediatric ROS (+)  Hematology negative hematology ROS (+) Blood dyscrasia, anemia   Anesthesia Other Findings Past Medical History: No date: Frequent headaches No date: GERD (gastroesophageal reflux disease) 2015: H/O drug abuse ([place])     Comment:  heroine, on Methadone by [place] more as teenager: History of migraine 2013: History of pyelonephritis No date: History of UTI     Comment:  last 2015 ~1995/96: Hx of viral meningitis     Comment:  hospitalized for 1 wk No date: Seasonal allergies  Past Surgical History: ~1994/95: TONSILLECTOMY  BMI    Body Mass Index: 20.88 kg/m      Reproductive/Obstetrics negative OB ROS                             Anesthesia Physical Anesthesia Plan  ASA: 2  Anesthesia Plan: General   Post-op Pain Management:    Induction: Intravenous  PONV Risk Score and Plan: Propofol infusion and TIVA  Airway Management Planned: Natural  Airway  Additional Equipment:   Intra-op Plan:   Post-operative Plan:   Informed Consent: I have reviewed the patients History and Physical, chart, labs and discussed the procedure including the risks, benefits and alternatives for the proposed anesthesia with the patient or authorized representative who has indicated his/her understanding and acceptance.     Dental Advisory Given  Plan Discussed with: CRNA and Surgeon  Anesthesia Plan Comments:        Anesthesia Quick Evaluation

## 2022-06-22 NOTE — Op Note (Signed)
[facility] Gastroenterology Patient Name: Sonya Finley Procedure Date: 06/22/2022 8:28 AM MRN: [id number] Account #: [id number] Date of Birth: [birth year] Admit Type: Outpatient Age: 37 Room: [facility] ENDO ROOM 4 Gender: Female Note Status: Finalized Instrument Name: Upper Endoscope [id number] Procedure:             Upper GI endoscopy Indications:           Iron deficiency anemia Providers:             Lucilla Lame MD, MD Referring MD:          Perrin Maltese, MD (Referring MD) Medicines:             Propofol per Anesthesia Complications:         No immediate complications. Procedure:             Pre-Anesthesia Assessment:                        - Prior to the procedure, a History and Physical was                         performed, and patient medications and allergies were                         reviewed. The patient's tolerance of previous                         anesthesia was also reviewed. The risks and benefits                         of the procedure and the sedation options and risks                         were discussed with the patient. All questions were                         answered, and informed consent was obtained. Prior                         Anticoagulants: The patient has taken no anticoagulant                         or antiplatelet agents. ASA Grade Assessment: II - A                         patient with mild systemic disease. After reviewing                         the risks and benefits, the patient was deemed in                         satisfactory condition to undergo the procedure.                        After obtaining informed consent, the endoscope was                         passed under direct vision. Throughout the procedure,  the patient's blood pressure, pulse, and oxygen                         saturations were monitored continuously. The Endoscope                         was introduced through the mouth,  and advanced to the                         second part of duodenum. The upper GI endoscopy was                         accomplished without difficulty. The patient tolerated                         the procedure well. Findings:      The examined esophagus was normal.      Two non-bleeding cratered gastric ulcers with no stigmata of bleeding       were found in the gastric antrum. Biopsies were taken with a cold       forceps for histology.      The examined duodenum was normal. Impression:            - Normal esophagus.                        - Non-bleeding gastric ulcers with no stigmata of                         bleeding. Biopsied.                        - Normal examined duodenum. Recommendation:        - Discharge patient to home.                        - Resume previous diet.                        - Continue present medications.                        - Await pathology results.                        - Perform a colonoscopy today. Procedure Code(s):     --- Professional ---                        [phone], Esophagogastroduodenoscopy, flexible,                         transoral; with biopsy, single or multiple Diagnosis Code(s):     --- Professional ---                        D50.9, Iron deficiency anemia, unspecified                        K25.9, Gastric ulcer, unspecified as acute or chronic,  without hemorrhage or perforation CPT copyright 2022 American Medical Association. All rights reserved. The codes documented in this report are preliminary and upon coder review may  be revised to meet current compliance requirements. Midge Minium MD, MD 06/22/2022 8:51:29 AM This report has been signed electronically. Number of Addenda: 0 Note Initiated On: 06/22/2022 8:28 AM Estimated Blood Loss:  Estimated blood loss: none.      [facility]

## 2022-06-22 NOTE — Op Note (Signed)
[facility] Gastroenterology Patient Name: Sonya Finley Procedure Date: 06/22/2022 8:27 AM MRN: [id number] Account #: [id number] Date of Birth: 1985-07-24 Admit Type: Outpatient Age: 37 Room: [facility] ENDO ROOM 4 Gender: Female Note Status: Finalized Instrument Name: Jasper Riling [id number] Procedure:             Colonoscopy Indications:           Iron deficiency anemia Providers:             Lucilla Lame MD, MD Referring MD:          Perrin Maltese, MD (Referring MD) Medicines:             Propofol per Anesthesia Complications:         No immediate complications. Procedure:             Pre-Anesthesia Assessment:                        - Prior to the procedure, a History and Physical was                         performed, and patient medications and allergies were                         reviewed. The patient's tolerance of previous                         anesthesia was also reviewed. The risks and benefits                         of the procedure and the sedation options and risks                         were discussed with the patient. All questions were                         answered, and informed consent was obtained. Prior                         Anticoagulants: The patient has taken no anticoagulant                         or antiplatelet agents. ASA Grade Assessment: II - A                         patient with mild systemic disease. After reviewing                         the risks and benefits, the patient was deemed in                         satisfactory condition to undergo the procedure.                        After obtaining informed consent, the colonoscope was                         passed under direct vision. Throughout the procedure,  the patient's blood pressure, pulse, and oxygen                         saturations were monitored continuously. The                         Colonoscope was introduced through the anus and                          advanced to the the terminal ileum. The colonoscopy                         was performed without difficulty. The patient                         tolerated the procedure well. The quality of the bowel                         preparation was excellent. Findings:      The perianal and digital rectal examinations were normal.      The terminal ileum appeared normal.      The colon (entire examined portion) appeared normal. Impression:            - The examined portion of the ileum was normal.                        - The entire examined colon is normal.                        - No specimens collected. Recommendation:        - Discharge patient to home.                        - Resume previous diet.                        - Continue present medications. Procedure Code(s):     --- Professional ---                        [phone], Colonoscopy, flexible; diagnostic, including                         collection of specimen(s) by brushing or washing, when                         performed (separate procedure) Diagnosis Code(s):     --- Professional ---                        D50.9, Iron deficiency anemia, unspecified CPT copyright 2022 American Medical Association. All rights reserved. The codes documented in this report are preliminary and upon coder review may  be revised to meet current compliance requirements. Lucilla Lame MD, MD 06/22/2022 9:08:51 AM This report has been signed electronically. Number of Addenda: 0 Note Initiated On: 06/22/2022 8:27 AM Scope Withdrawal Time: 0 hours 6 minutes 43 seconds  Total Procedure Duration: 0 hours 12 minutes 17 seconds  Estimated Blood Loss:  Estimated blood loss: none.      [facility]

## 2022-06-22 NOTE — H&P (Signed)
Lucilla Lame, MD [address] [place], [place] [address] Phone:[phone] Fax : [phone]  Primary Care Physician:  Perrin Maltese, MD Primary Gastroenterologist:  Dr. Allen Norris  Pre-Procedure History & Physical: HPI:  Sonya Finley is a 37 y.o. female is here for an endoscopy and colonoscopy.   Past Medical History:  Diagnosis Date   Frequent headaches    GERD (gastroesophageal reflux disease)    H/O drug abuse ([place]) 2015   heroine, on Methadone by [place]   History of migraine more as teenager   History of pyelonephritis 2013   History of UTI    last 2015   Hx of viral meningitis ~1995/96   hospitalized for 1 wk   Seasonal allergies     Past Surgical History:  Procedure Laterality Date   TONSILLECTOMY  ~1994/95    Prior to Admission medications   Medication Sig Start Date End Date Taking? Authorizing Provider  escitalopram (LEXAPRO) 10 MG tablet Lexapro 10 MG Oral Tablet 03/29/21  Yes [provider]  ibuprofen (ADVIL) 200 MG tablet Take 200 mg by mouth as needed.   Yes [provider]  omeprazole (PRILOSEC) 40 MG capsule Take 40 mg by mouth daily. 10/25/21  Yes [provider]  Vitamin D, Ergocalciferol, (DRISDOL) 1.25 MG (50000 UNIT) CAPS capsule Take 50,000 Units by mouth once a week. 1 Capsule(s) By Mouth Once a Week 08/01/21  Yes [provider]  acetaminophen (TYLENOL) 325 MG tablet Take 325 mg by mouth as needed.    [provider]  diphenhydrAMINE (BENADRYL) 25 MG tablet Take 25 mg by mouth as needed. Patient not taking: Reported on 05/03/2022    [provider]  medroxyPROGESTERone (DEPO-PROVERA) 150 MG/ML injection Inject 1 mL (150 mg total) into the muscle every 3 (three) months. 11/03/21   April Manson, MD  METHADONE HCL PO Take 5 mg by mouth daily. Patient not taking: Reported on 06/22/2022    [provider]    Allergies as of 02/16/2022   (No Known  Allergies)    Family History  Problem Relation Age of Onset   Crohn's disease Mother    Alcohol abuse Father    Drug abuse Father    Crohn's disease Maternal Aunt    CAD Maternal Grandmother    Cancer Neg Hx    Diabetes Neg Hx     Social History   Socioeconomic History   Marital status: Single    Spouse name: Not on file   Number of children: Not on file   Years of education: Not on file   Highest education level: Not on file  Occupational History   Not on file  Tobacco Use   Smoking status: Former    Packs/day: 1.00    Types: Cigarettes    Start date: 06/05/2002   Smokeless tobacco: Never  Vaping Use   Vaping Use: Every day  Substance and Sexual Activity   Alcohol use: No    Alcohol/week: 0.0 standard drinks of alcohol   Drug use: No    Comment: h/o heroine use   Sexual activity: Yes    Birth control/protection: None  Other Topics Concern   Not on file  Social History Narrative   Lives with mom and boyfriend, 1 dog, 6 cats   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[employer]   Activity: no regular exercise - remodeling house   Diet: some water, vegetables regularly   Social Determinants of Health   Financial Resource Strain:  Not on file  Food Insecurity: Not on file  Transportation Needs: No Transportation Needs (04/22/2020)   PRAPARE - Hydrologist (Medical): No    Lack of Transportation (Non-Medical): No  Physical Activity: Not on file  Stress: Not on file  Social Connections: Not on file  Intimate Partner Violence: Not on file    Review of Systems: See HPI, otherwise negative ROS  Physical Exam: BP 118/80   Pulse 88   Temp (!) 97 F (36.1 C) (Temporal)   Resp 16   Wt 46.9 kg   SpO2 100%   BMI 20.88 kg/m  General:   Alert,  pleasant and cooperative in NAD Head:  Normocephalic and atraumatic. Neck:  Supple; no masses or thyromegaly. Lungs:  Clear throughout to auscultation.    Heart:  Regular rate and rhythm. Abdomen:  Soft, nontender  and nondistended. Normal bowel sounds, without guarding, and without rebound.   Neurologic:  Alert and  oriented x4;  grossly normal neurologically.  Impression/Plan: [place] is here for an endoscopy and colonoscopy to be performed for IDA  Risks, benefits, limitations, and alternatives regarding  endoscopy and colonoscopy have been reviewed with the patient.  Questions have been answered.  All parties agreeable.   Lucilla Lame, MD  06/22/2022, 8:35 AM

## 2022-06-22 NOTE — Transfer of Care (Signed)
Immediate Anesthesia Transfer of Care Note  Patient: Sonya Finley  Procedure(s) Performed: COLONOSCOPY WITH PROPOFOL ESOPHAGOGASTRODUODENOSCOPY (EGD)  Patient Location: PACU and Endoscopy Unit  Anesthesia Type:General  Level of Consciousness: drowsy and patient cooperative  Airway & Oxygen Therapy: Patient Spontanous Breathing  Post-op Assessment: Report given to RN and Post -op Vital signs reviewed and stable  Post vital signs: Reviewed and stable  Last Vitals:  Vitals Value Taken Time  BP 95/67 06/22/22 0912  Temp    Pulse 72 06/22/22 0912  Resp 17 06/22/22 0912  SpO2 99 % 06/22/22 0912  Vitals shown include unvalidated device data.  Last Pain:  Vitals:   06/22/22 0825  TempSrc: Temporal         Complications: No notable events documented.

## 2022-06-23 ENCOUNTER — Encounter: Payer: Self-pay | Admitting: Gastroenterology

## 2022-06-23 LAB — SURGICAL PATHOLOGY

## 2022-06-26 ENCOUNTER — Encounter: Payer: Self-pay | Admitting: Gastroenterology

## 2022-07-13 ENCOUNTER — Ambulatory Visit: Payer: 59 | Admitting: Internal Medicine

## 2022-07-13 ENCOUNTER — Other Ambulatory Visit: Payer: Self-pay

## 2022-07-13 ENCOUNTER — Encounter: Payer: Self-pay | Admitting: Oncology

## 2022-07-13 ENCOUNTER — Encounter: Payer: Self-pay | Admitting: Internal Medicine

## 2022-07-13 VITALS — BP 112/70 | HR 87 | Ht 59.0 in | Wt 107.8 lb

## 2022-07-13 DIAGNOSIS — R5383 Other fatigue: Secondary | ICD-10-CM

## 2022-07-13 DIAGNOSIS — K219 Gastro-esophageal reflux disease without esophagitis: Secondary | ICD-10-CM

## 2022-07-13 DIAGNOSIS — D500 Iron deficiency anemia secondary to blood loss (chronic): Secondary | ICD-10-CM | POA: Diagnosis not present

## 2022-07-13 DIAGNOSIS — F411 Generalized anxiety disorder: Secondary | ICD-10-CM

## 2022-07-13 DIAGNOSIS — E559 Vitamin D deficiency, unspecified: Secondary | ICD-10-CM

## 2022-07-13 MED ORDER — OMEPRAZOLE 40 MG PO CPDR: 40.0000 mg | DELAYED_RELEASE_CAPSULE | Freq: Every day | ORAL | 3 refills | Status: AC

## 2022-07-13 NOTE — Progress Notes (Signed)
Established Patient Office Visit  Subjective   Patient ID: Sonya Finley, female    DOB: 03-Feb-1986  Age: 37 y.o. MRN: [id number]  Chief Complaint  Patient presents with   Follow-up    4 month follow up    Patient comes in for her Follow up. Feels very well today. Now off Methadone treatment for a month , and doing well with out it. Occasionally get heartburn- needs refill on Omeprazole. Will be seeing her Hematologist and ob/gyn later on . Also to get labs today.     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, on Methadone by [place]   History of migraine more as teenager   History of pyelonephritis 2013   History of UTI    last 2015   Hx of viral meningitis ~1995/96   hospitalized for 1 wk   Seasonal allergies     Social History   Socioeconomic History   Marital status: Single    Spouse name: Not on file   Number of children: Not on file   Years of education: Not on file   Highest education level: Not on file  Occupational History   Not on file  Tobacco Use   Smoking status: Former    Packs/day: 1.00    Types: Cigarettes    Start date: 06/05/2002   Smokeless tobacco: Never  Vaping Use   Vaping Use: Every day  Substance and Sexual Activity   Alcohol use: No    Alcohol/week: 0.0 standard drinks of alcohol   Drug use: Not Currently    Types: Heroin    Comment: h/o heroine use   Sexual activity: Yes    Birth control/protection: None  Other Topics Concern   Not on file  Social History Narrative   Lives with mom and boyfriend, 1 dog, 6 cats   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[employer]   Activity: no regular exercise - remodeling house   Diet: some water, vegetables regularly   Social Determinants of Health   Financial Resource Strain: Not on file  Food Insecurity: Not on file  Transportation Needs: No Transportation Needs (04/22/2020)    PRAPARE - Hydrologist (Medical): No    Lack of Transportation (Non-Medical): No  Physical Activity: Not on file  Stress: Not on file  Social Connections: Not on file  Intimate Partner Violence: Not on file    Family History  Problem Relation Age of Onset   Crohn's disease Mother    Alcohol abuse Father    Drug abuse Father    Crohn's disease Maternal Aunt    CAD Maternal Grandmother    Cancer Neg Hx    Diabetes Neg Hx     No Known Allergies  Review of Systems  Constitutional: Negative.   HENT: Negative.    Eyes: Negative.   Respiratory: Negative.    Cardiovascular: Negative.   Gastrointestinal: Negative.   Genitourinary: Negative.   Musculoskeletal: Negative.   Skin: Negative.   Neurological: Negative.   Endo/Heme/Allergies: Negative.   Psychiatric/Behavioral: Negative.         Objective:     BP 112/70   Pulse 87   Ht 4\' 11"  (1.499 m)   Wt 107 lb 12.8 oz (48.9 kg)   SpO2 99%   BMI 21.77 kg/m   Vitals:   07/13/22 0922  BP: 112/70  Pulse: 87  Height: 4\' 11"  (1.499 m)  Weight: 107 lb 12.8 oz (48.9 kg)  SpO2: 99%  BMI (Calculated): 21.76    Physical Exam Vitals and nursing note reviewed.  Constitutional:      Appearance: Normal appearance.  HENT:     Head: Normocephalic and atraumatic.     Nose: Nose normal.  Eyes:     Pupils: Pupils are equal, round, and reactive to light.  Cardiovascular:     Rate and Rhythm: Normal rate and regular rhythm.  Pulmonary:     Effort: Pulmonary effort is normal.     Breath sounds: Normal breath sounds.  Musculoskeletal:        General: Normal range of motion.     Cervical back: Normal range of motion and neck supple.  Skin:    General: Skin is warm and dry.  Neurological:     General: No focal deficit present.     Mental Status: She is alert and oriented to person, place, and time.  Psychiatric:        Mood and Affect: Mood normal.      No results found for any visits on  07/13/22.  Recent Results (from the past 2160 hour(s))  Urinalysis, Routine w reflex microscopic     Status: Abnormal   Collection Time: 06/09/22  5:34 PM  Result Value Ref Range   Color, Urine STRAW (A) YELLOW   APPearance CLEAR (A) CLEAR   Specific Gravity, Urine 1.003 (L) 1.005 - 1.030   pH 7.0 5.0 - 8.0   Glucose, UA NEGATIVE NEGATIVE mg/dL   Hgb urine dipstick SMALL (A) NEGATIVE   Bilirubin Urine NEGATIVE NEGATIVE   Ketones, ur NEGATIVE NEGATIVE mg/dL   Protein, ur NEGATIVE NEGATIVE mg/dL   Nitrite NEGATIVE NEGATIVE   Leukocytes,Ua NEGATIVE NEGATIVE   RBC / HPF 0-5 0 - 5 RBC/hpf   WBC, UA 0-5 0 - 5 WBC/hpf   Bacteria, UA RARE (A) NONE SEEN   Squamous Epithelial / HPF 0-5 0 - 5 /HPF    Comment: Performed at [facility], [address] [place], [place] [address]  CBC     Status: None   Collection Time: 06/09/22  5:34 PM  Result Value Ref Range   WBC 7.3 4.0 - 10.5 K/uL   RBC 4.43 3.87 - 5.11 MIL/uL   Hemoglobin 13.4 12.0 - 15.0 g/dL   HCT 40.1 36.0 - 46.0 %   MCV 90.5 80.0 - 100.0 fL   MCH 30.2 26.0 - 34.0 pg   MCHC 33.4 30.0 - 36.0 g/dL   RDW 11.9 11.5 - 15.5 %   Platelets 292 150 - 400 K/uL   nRBC 0.0 0.0 - 0.2 %    Comment: Performed at [facility], [address] [place], [place] [address]  Basic metabolic panel     Status: Abnormal   Collection Time: 06/09/22  5:34 PM  Result Value Ref Range   Sodium 138 135 - 145 mmol/L   Potassium 4.4 3.5 - 5.1 mmol/L   Chloride 107 98 - 111 mmol/L   CO2 24 22 - 32 mmol/L   Glucose, Bld 90 70 - 99 mg/dL    Comment: Glucose reference range applies only to samples taken after fasting for at least 8 hours.   BUN 5 (L) 6 - 20 mg/dL   Creatinine, Ser 0.82 0.44 - 1.00 mg/dL   Calcium 9.1 8.9 - 10.3 mg/dL   GFR, Estimated >60 >60 mL/min  Comment: (NOTE) Calculated using the CKD-EPI Creatinine Equation (2021)    Anion gap 7 5 - 15    Comment: Performed at [facility], [address]  [place], [place] [address]  Pregnancy, urine POC     Status: None   Collection Time: 06/22/22  8:27 AM  Result Value Ref Range   Preg Test, Ur NEGATIVE NEGATIVE    Comment:        THE SENSITIVITY OF THIS METHODOLOGY IS >24 mIU/mL   Surgical pathology     Status: None   Collection Time: 06/22/22  8:48 AM  Result Value Ref Range   SURGICAL PATHOLOGY      SURGICAL PATHOLOGY CASE: [id number] PATIENT: [facility] Surgical Pathology Report     Specimen Submitted: A. Stomach; cbx  Clinical History: Iron deficiency anemia D50.9.  Gastric ulcer, normal colonoscopy    DIAGNOSIS: A. STOMACH; COLD BIOPSY: - GASTRIC ANTRAL MUCOSA WITH REACTIVE GASTROPATHY. - NEGATIVE FOR H. PYLORI, DYSPLASIA, AND MALIGNANCY.  Comment: The differential diagnosis for these findings includes drug/chemical injury (NSAID vs. other), bile reflux, and changes adjacent to an area of healing ulceration. Clinical correlation with endoscopic findings is required.  GROSS DESCRIPTION: A. Labeled: Cbx gastric ulcer Received: Formalin Collection time: 8:48 AM on 06/22/2022 Placed into formalin time: 8:48 AM on 06/22/2022 Tissue fragment(s): 2 Size: Both 0.4 cm Description: Pink soft tissue fragments Entirely submitted in 1 cassette.  CM 06/22/2022  Final Diagnosis performed by Allena Napoleon, MD.   Electronically signed 06/23/2022 9:02:04A M The electronic signature indicates that the named Attending Pathologist has evaluated the specimen Technical component performed at [facility], [address], [place], [place] [address] Lab: [phone] Dir: Rush Farmer, MD, MMM  Professional component performed at [facility], [facility], [address], [place], [place] [address] Lab: [phone] Dir: Kathi Simpers, MD       Assessment & Plan:   Problem List Items Addressed This Visit     GERD (gastroesophageal reflux disease)   Relevant Medications   omeprazole (PRILOSEC) 40 MG capsule    Iron deficiency anemia secondary to blood loss (chronic)   Relevant Orders   CBC With Differential   Ferritin   Vitamin D insufficiency   Relevant Orders   Vitamin D 1,25 Dihydroxy   GAD (generalized anxiety disorder)   Other Visit Diagnoses     Fatigue, unspecified type    -  Primary   Relevant Orders   Ferritin   Creatinine with Est GFR   COMPLETE METABOLIC PANEL WITH GFR       Return in about 6 months (around 01/11/2023).   Total time spent: 30 minutes  Perrin Maltese, MD

## 2022-07-14 LAB — CMP14+EGFR
ALT: 46 IU/L — ABNORMAL HIGH (ref 0–32)
AST: 35 IU/L (ref 0–40)
Albumin/Globulin Ratio: 1.7 (ref 1.2–2.2)
Albumin: 4 g/dL (ref 3.9–4.9)
Alkaline Phosphatase: 68 IU/L (ref 44–121)
BUN/Creatinine Ratio: 7 — ABNORMAL LOW (ref 9–23)
BUN: 6 mg/dL (ref 6–20)
Bilirubin Total: <0.2 mg/dL (ref 0.0–1.2)
CO2: 22 mmol/L (ref 20–29)
Calcium: 8.8 mg/dL (ref 8.7–10.2)
Chloride: 106 mmol/L (ref 96–106)
Creatinine, Ser: 0.86 mg/dL (ref 0.57–1.00)
Globulin, Total: 2.4 g/dL (ref 1.5–4.5)
Glucose: 67 mg/dL — ABNORMAL LOW (ref 70–99)
Potassium: 4.1 mmol/L (ref 3.5–5.2)
Sodium: 142 mmol/L (ref 134–144)
Total Protein: 6.4 g/dL (ref 6.0–8.5)
eGFR: 89 mL/min/{1.73_m2} (ref >59)

## 2022-07-14 LAB — CBC WITH DIFFERENTIAL
Basophils Absolute: 0.1 10*3/uL (ref 0.0–0.2)
Basos: 1 %
EOS (ABSOLUTE): 0.2 10*3/uL (ref 0.0–0.4)
Eos: 4 %
Hematocrit: 40.9 % (ref 34.0–46.6)
Hemoglobin: 13.2 g/dL (ref 11.1–15.9)
Immature Grans (Abs): 0 10*3/uL (ref 0.0–0.1)
Immature Granulocytes: 0 %
Lymphocytes Absolute: 2.1 10*3/uL (ref 0.7–3.1)
Lymphs: 38 %
MCH: 29.3 pg (ref 26.6–33.0)
MCHC: 32.3 g/dL (ref 31.5–35.7)
MCV: 91 fL (ref 79–97)
Monocytes Absolute: 0.4 10*3/uL (ref 0.1–0.9)
Monocytes: 8 %
Neutrophils Absolute: 2.7 10*3/uL (ref 1.4–7.0)
Neutrophils: 49 %
RBC: 4.5 x10E6/uL (ref 3.77–5.28)
RDW: 11.8 % (ref 11.7–15.4)
WBC: 5.5 10*3/uL (ref 3.4–10.8)

## 2022-07-14 LAB — VITAMIN D 25 HYDROXY (VIT D DEFICIENCY, FRACTURES): Vit D, 25-Hydroxy: 13.4 ng/mL — ABNORMAL LOW (ref 30.0–100.0)

## 2022-07-14 LAB — FERRITIN: Ferritin: 25 ng/mL (ref 15–150)

## 2022-07-26 ENCOUNTER — Ambulatory Visit (INDEPENDENT_AMBULATORY_CARE_PROVIDER_SITE_OTHER): Payer: 59

## 2022-07-26 VITALS — BP 110/70 | Ht 59.0 in | Wt 106.0 lb

## 2022-07-26 DIAGNOSIS — Z3042 Encounter for surveillance of injectable contraceptive: Secondary | ICD-10-CM | POA: Diagnosis not present

## 2022-07-26 MED ORDER — MEDROXYPROGESTERONE ACETATE 150 MG/ML IM SUSP
150.0000 mg | Freq: Once | INTRAMUSCULAR | Status: AC
  Administered 2022-07-26: 150 mg via INTRAMUSCULAR

## 2022-07-26 NOTE — Progress Notes (Signed)
Date last pap: 02/02/22. Last Depo-Provera: 05/03/22. Side Effects if any: no. Serum HCG indicated? none. Depo-Provera 150 mg IM given by: Quintella Baton cma. Next appointment due 10/12/22-10/26/22.

## 2022-07-26 NOTE — Patient Instructions (Signed)
Contraceptive Injection A contraceptive injection is a shot that prevents pregnancy. It is also called a birth control shot. The shot contains the hormone progestin, which prevents pregnancy by: Stopping the ovaries from releasing eggs. Thickening cervical mucus to prevent sperm from entering the cervix. Thinning the lining of the uterus to prevent a fertilized egg from attaching to the uterus. Contraceptive injections are given under the skin (subcutaneous) or into a muscle (intramuscular). For these shots to work, you must get one of them every 3 months (12-13 weeks) from a health care provider. Tell a health care provider about: Any allergies you have. All medicines you are taking, including vitamins, herbs, eye drops, creams, and over-the-counter medicines. Any blood disorders you have. Any medical conditions you have. Whether you are pregnant or may be pregnant. What are the risks? Generally, this is a safe procedure. However, problems may occur, including: Mood changes or depression. Loss of bone density (osteoporosis) after long-term use. Blood clots. This is rare. Higher risk of an egg being fertilized outside your uterus (ectopic pregnancy).This is rare. What happens before the procedure? Your health care provider may do a routine physical exam. You may have a test to make sure you are not pregnant. What happens during the procedure?  The area where the shot will be given will be cleaned and sanitized with alcohol. A needle will be inserted into a muscle in your upper arm or buttock, or into the skin of your thigh or abdomen. The needle will be attached to a syringe with the medicine inside of it. The medicine will be pushed through the syringe and injected into your body. A small bandage (dressing) may be placed over the injection site. What can I expect after the procedure? After the procedure, it is common to have: Soreness around the injection site for a couple of  days. Irregular menstrual bleeding. Weight gain. Breast tenderness. Headaches. Discomfort in your abdomen. Ask your health care provider whether you need to use an added method of birth control (backup contraception), such as a condom, sponge, or spermicide. If the first shot is given 1-7 days after the start of your last menstrual period, you will not need backup contraception. If the first shot is given at any other time during your menstrual cycle, you should avoid having sex. If you do have sex, you will need to use backup contraception for 7 days after you receive the shot. Follow these instructions at home: General instructions Take over-the-counter and prescription medicines only as told by your health care provider. Do not rub or massage the injection site. Track your menstrual periods so you will know if they become irregular. Always use a condom to protect against sexually transmitted infections (STIs). Make sure you schedule an appointment in time for your next shot and mark it on your calendar. You must get an injection every 3 months (12-13 weeks) to prevent pregnancy. Lifestyle Do not use any products that contain nicotine or tobacco. These products include cigarettes, chewing tobacco, and vaping devices, such as e-cigarettes. If you need help quitting, ask your health care provider. Eat foods that are high in calcium and vitamin D, such as milk, cheese, and salmon. Doing this may help with any loss in bone density caused by the contraceptive injection. Ask your health care provider for dietary recommendations. Contact a health care provider if you: Have nausea or vomiting. Have abnormal vaginal discharge or bleeding. Miss a menstrual period or think you might be pregnant. Experience mood changes  or depression. Feel dizzy or light-headed. Have leg pain. Get help right away if you: Have chest pain or cough up blood. Have shortness of breath. Have a severe headache that does  not go away. Have numbness in any part of your body. Have slurred speech or vision problems. Have vaginal bleeding that is abnormally heavy or does not stop, or you have severe pain in your abdomen. Have depression that does not get better with treatment. If you ever feel like you may hurt yourself or others, or have thoughts about taking your own life, get help right away. Go to your nearest emergency department or: Call your local emergency services (911 in the [place] Call a suicide crisis helpline, such as the [place] at (337)848-3488 or 988 in the [place]. This is open 24 hours a day in the [place] Text the Crisis Text Line at 551-286-9780 (in the [place] Summary A contraceptive injection is a shot that prevents pregnancy. It is also called the birth control shot. The shot is given under the skin (subcutaneous) or into a muscle (intramuscular). After this procedure, it is common to have soreness around the injection site for a couple of days. To prevent pregnancy, the shot must be given by a health care provider every 3 months (12-13 weeks). After you have the shot, ask your health care provider whether you need to use an added method of birth control (backup contraception), such as a condom, sponge, or spermicide. This information is not intended to replace advice given to you by your health care provider. Make sure you discuss any questions you have with your health care provider. Document Revised: 12/15/2020 Document Reviewed: 12/01/2019 Elsevier Patient Education  [employer].

## 2022-10-18 ENCOUNTER — Ambulatory Visit (INDEPENDENT_AMBULATORY_CARE_PROVIDER_SITE_OTHER): Payer: 59

## 2022-10-18 VITALS — BP 122/88 | HR 90 | Wt 106.0 lb

## 2022-10-18 DIAGNOSIS — Z3042 Encounter for surveillance of injectable contraceptive: Secondary | ICD-10-CM

## 2022-10-18 MED ORDER — MEDROXYPROGESTERONE ACETATE 150 MG/ML IM SUSY
150.0000 mg | PREFILLED_SYRINGE | Freq: Once | INTRAMUSCULAR | Status: AC
  Administered 2022-10-18: 150 mg via INTRAMUSCULAR

## 2022-10-18 NOTE — Progress Notes (Signed)
    NURSE VISIT NOTE  Subjective:    Patient ID: Sonya Finley, female    DOB: August 15, 1985, 37 y.o.   MRN: [id number]  HPI  Patient is a 37 y.o. G0P0000 female who presents for depo provera injection.   Objective:    BP 122/88   Pulse 90   Wt 106 lb (48.1 kg)   BMI 21.41 kg/m   Last Annual: 02/02/2022. Last pap: 02/02/2022. Last Depo-Provera: 07/26/2022. Side Effects if any: none. Serum HCG indicated? No . Depo-Provera 150 mg IM given by: Doristine Devoid, CMA. Site: Right Upper Outer Quandrant    Assessment:   1. Encounter for surveillance of injectable contraceptive      Plan:   Next appointment due between July 31st and August 14th.    Burtis Junes, CMA

## 2023-01-10 ENCOUNTER — Ambulatory Visit (INDEPENDENT_AMBULATORY_CARE_PROVIDER_SITE_OTHER): Payer: 59

## 2023-01-10 VITALS — BP 103/74 | HR 93 | Ht 59.0 in | Wt 105.0 lb

## 2023-01-10 DIAGNOSIS — Z3042 Encounter for surveillance of injectable contraceptive: Secondary | ICD-10-CM

## 2023-01-10 MED ORDER — MEDROXYPROGESTERONE ACETATE 150 MG/ML IM SUSY
150.0000 mg | PREFILLED_SYRINGE | Freq: Once | INTRAMUSCULAR | Status: AC
  Administered 2023-01-10: 150 mg via INTRAMUSCULAR

## 2023-01-10 NOTE — Progress Notes (Signed)
    NURSE VISIT NOTE  Subjective:    Patient ID: Sonya Finley, female    DOB: Dec 28, 1985, 37 y.o.   MRN: [id number]  HPI  Patient is a 37 y.o. G0P0000 female who presents for depo provera injection.   Objective:    There were no vitals taken for this visit.   Last Annual: 02/02/2022. Last pap: 02/02/2022. Last Depo-Provera:10/18/22 Side Effects if any: none. Serum HCG indicated? No . Depo-Provera 150 mg IM given by: Beverely Pace, CMA. Site: Left Ventrogluteal    Assessment:   1. Encounter for management and injection of depo-Provera      Plan:   Next appointment due between OCT 24 and NOV 7 .    Loney Laurence, CMA

## 2023-01-11 ENCOUNTER — Ambulatory Visit: Payer: 59 | Admitting: Internal Medicine

## 2023-01-19 ENCOUNTER — Encounter: Payer: Self-pay | Admitting: Internal Medicine

## 2023-01-19 ENCOUNTER — Ambulatory Visit: Payer: 59 | Admitting: Internal Medicine

## 2023-01-19 VITALS — BP 105/79 | HR 93 | Ht 59.0 in | Wt 107.0 lb

## 2023-01-19 DIAGNOSIS — F411 Generalized anxiety disorder: Secondary | ICD-10-CM | POA: Diagnosis not present

## 2023-01-19 DIAGNOSIS — K219 Gastro-esophageal reflux disease without esophagitis: Secondary | ICD-10-CM

## 2023-01-19 DIAGNOSIS — R5383 Other fatigue: Secondary | ICD-10-CM | POA: Diagnosis not present

## 2023-01-19 DIAGNOSIS — E559 Vitamin D deficiency, unspecified: Secondary | ICD-10-CM | POA: Diagnosis not present

## 2023-01-19 NOTE — Progress Notes (Signed)
Established Patient Office Visit  Subjective:  Patient ID: Sonya Finley, female    DOB: 1985-06-08  Age: 37 y.o. MRN: [id number]  Chief Complaint  Patient presents with   Follow-up    6 mo f/u    Patient is here for her 16-month follow-up.  She is doing well significant complaints other than mild fatigue.  She is supposed to be on vitamin D supplement but admits she has not been taking it regularly.  Will check blood work today.    No other concerns at this time.   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, on Methadone by [facility]   History of migraine more as teenager   History of pyelonephritis 2013   History of UTI    last 2015   Hx of viral meningitis ~1995/96   hospitalized for 1 wk   Seasonal allergies     Past Surgical History:  Procedure Laterality Date   COLONOSCOPY WITH PROPOFOL N/A 06/22/2022   Procedure: COLONOSCOPY WITH PROPOFOL;  Surgeon: Midge Minium, MD;  Location: [facility] ENDOSCOPY;  Service: Endoscopy;  Laterality: N/A;   ESOPHAGOGASTRODUODENOSCOPY N/A 06/22/2022   Procedure: ESOPHAGOGASTRODUODENOSCOPY (EGD);  Surgeon: Midge Minium, MD;  Location: [facility] ENDOSCOPY;  Service: Endoscopy;  Laterality: N/A;   TONSILLECTOMY  ~1994/95    Social History   Socioeconomic History   Marital status: Single    Spouse name: Not on file   Number of children: Not on file   Years of education: Not on file   Highest education level: Not on file  Occupational History   Not on file  Tobacco Use   Smoking status: Former    Current packs/day: 1.00    Average packs/day: 1 pack/day for 20.6 years (20.6 ttl pk-yrs)    Types: Cigarettes    Start date: 06/05/2002   Smokeless tobacco: Never  Vaping Use   Vaping status: Every Day  Substance and Sexual Activity   Alcohol use: No    Alcohol/week: 0.0 standard drinks of alcohol   Drug use: Not  Currently    Types: Heroin    Comment: h/o heroine use   Sexual activity: Yes    Birth control/protection: Injection  Other Topics Concern   Not on file  Social History Narrative   Lives with mom and boyfriend, 1 dog, 6 cats   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[employer]   Activity: no regular exercise - remodeling house   Diet: some water, vegetables regularly   Social Determinants of Health   Financial Resource Strain: Not on file  Food Insecurity: Not on file  Transportation Needs: No Transportation Needs (04/22/2020)   PRAPARE - Administrator, Civil Service (Medical): No    Lack of Transportation (Non-Medical): No  Physical Activity: Not on file  Stress: Not on file  Social Connections: Not on file  Intimate Partner Violence: Not on file    Family History  Problem Relation Age of Onset   Crohn's disease Mother    Alcohol abuse Father    Drug abuse Father    Crohn's disease Maternal Aunt    CAD Maternal Grandmother    Cancer Neg Hx    Diabetes Neg Hx     No Known Allergies  Review of Systems  Constitutional: Negative.   HENT: Negative.    Eyes: Negative.   Respiratory: Negative.  Negative for cough  and shortness of breath.   Cardiovascular: Negative.  Negative for chest pain, palpitations and leg swelling.  Gastrointestinal: Negative.  Negative for abdominal pain, constipation, diarrhea, heartburn, nausea and vomiting.  Genitourinary: Negative.  Negative for dysuria and flank pain.  Musculoskeletal: Negative.  Negative for joint pain and myalgias.  Skin: Negative.   Neurological: Negative.  Negative for dizziness and headaches.  Endo/Heme/Allergies: Negative.   Psychiatric/Behavioral: Negative.  Negative for depression and suicidal ideas. The patient is not nervous/anxious.        Objective:   BP 105/79   Pulse 93   Ht 4\' 11"  (1.499 m)   Wt 107 lb (48.5 kg)   SpO2 99%   BMI 21.61 kg/m   Vitals:   01/19/23 1036  BP: 105/79  Pulse: 93  Height: 4\' 11"   (1.499 m)  Weight: 107 lb (48.5 kg)  SpO2: 99%  BMI (Calculated): 21.6    Physical Exam Vitals and nursing note reviewed.  Constitutional:      Appearance: Normal appearance.  HENT:     Head: Normocephalic and atraumatic.     Nose: Nose normal.     Mouth/Throat:     Mouth: Mucous membranes are moist.     Pharynx: Oropharynx is clear.  Eyes:     Conjunctiva/sclera: Conjunctivae normal.     Pupils: Pupils are equal, round, and reactive to light.  Cardiovascular:     Rate and Rhythm: Normal rate and regular rhythm.     Pulses: Normal pulses.     Heart sounds: Normal heart sounds. No murmur heard. Pulmonary:     Effort: Pulmonary effort is normal.     Breath sounds: Normal breath sounds. No wheezing.  Abdominal:     General: Bowel sounds are normal.     Palpations: Abdomen is soft.     Tenderness: There is no abdominal tenderness. There is no right CVA tenderness or left CVA tenderness.  Musculoskeletal:        General: Normal range of motion.     Cervical back: Normal range of motion.     Right lower leg: No edema.     Left lower leg: No edema.  Skin:    General: Skin is warm and dry.  Neurological:     General: No focal deficit present.     Mental Status: She is alert and oriented to person, place, and time.  Psychiatric:        Mood and Affect: Mood normal.        Behavior: Behavior normal.      No results found for any visits on 01/19/23.  No results found for this or any previous visit (from the past 2160 hour(s)).    Assessment & Plan:  To continue all medications as such.  Check labs today. Problem List Items Addressed This Visit     GERD (gastroesophageal reflux disease) - Primary   Vitamin D insufficiency   Relevant Orders   Vitamin D (25 hydroxy)   GAD (generalized anxiety disorder)   Other Visit Diagnoses     Fatigue, unspecified type       Relevant Orders   CMP14+EGFR       Return in about 6 months (around 07/22/2023).   Total time  spent: 25 minutes  Margaretann Loveless, MD  01/19/2023   This document may have been prepared by [facility] Voice Recognition software and as such may include unintentional dictation errors.

## 2023-01-20 LAB — CMP14+EGFR
ALT: 25 IU/L (ref 0–32)
AST: 22 IU/L (ref 0–40)
Albumin: 4.3 g/dL (ref 3.9–4.9)
Alkaline Phosphatase: 61 IU/L (ref 44–121)
BUN/Creatinine Ratio: 6 — ABNORMAL LOW (ref 9–23)
BUN: 6 mg/dL (ref 6–20)
Bilirubin Total: 0.3 mg/dL (ref 0.0–1.2)
CO2: 22 mmol/L (ref 20–29)
Calcium: 9.3 mg/dL (ref 8.7–10.2)
Chloride: 107 mmol/L — ABNORMAL HIGH (ref 96–106)
Creatinine, Ser: 0.93 mg/dL (ref 0.57–1.00)
Globulin, Total: 2.4 g/dL (ref 1.5–4.5)
Glucose: 69 mg/dL — ABNORMAL LOW (ref 70–99)
Potassium: 4.2 mmol/L (ref 3.5–5.2)
Sodium: 142 mmol/L (ref 134–144)
Total Protein: 6.7 g/dL (ref 6.0–8.5)
eGFR: 81 mL/min/{1.73_m2} (ref >59)

## 2023-01-20 LAB — VITAMIN D 25 HYDROXY (VIT D DEFICIENCY, FRACTURES): Vit D, 25-Hydroxy: 49.4 ng/mL (ref 30.0–100.0)

## 2023-01-22 NOTE — Progress Notes (Signed)
Patient notified

## 2023-02-05 NOTE — Progress Notes (Unsigned)
PCP:  Margaretann Loveless, MD   No chief complaint on file.    HPI:      Ms. Sonya Finley is a 37 y.o. G0P0000 whose LMP was No LMP recorded., presents today for her annual examination.  Her menses are {norm/abn:715}, lasting {number: 22536} days.  Dysmenorrhea {dysmen:716}. She {does:18564} have intermenstrual bleeding.  Sex activity: {sex active: 315163}.  Last Pap: 02/02/22 Results were: no abnormalities /neg HPV DNA 2021 Hx of STDs: {STD hx:14358} There is no FH of breast cancer. There is no FH of ovarian cancer. The patient {does:18564} do self-breast exams.  Tobacco use: {tob:20664} Alcohol use: {Alcohol:11675} No drug use.  Exercise: {exercise:31265}  She {does:18564} get adequate calcium and Vitamin D in her diet.  Patient Active Problem List   Diagnosis Date Noted   Vitamin D insufficiency 07/13/2022   GAD (generalized anxiety disorder) 07/13/2022   Acute gastric ulcer without hemorrhage or perforation 06/22/2022   Encounter for surveillance of injectable contraceptive 05/03/2022   Iron deficiency anemia secondary to blood loss (chronic) 05/05/2021   Onychomycosis 03/02/2015   Thickened nails 03/02/2015   GERD (gastroesophageal reflux disease) 03/02/2015   Opiate dependence (HCC) 03/02/2015   Bunion of great toe of right foot 03/02/2015   Smoker 03/02/2015   H/O drug abuse [facility])     Past Surgical History:  Procedure Laterality Date   COLONOSCOPY WITH PROPOFOL N/A 06/22/2022   Procedure: COLONOSCOPY WITH PROPOFOL;  Surgeon: Midge Minium, MD;  Location: [facility] ENDOSCOPY;  Service: Endoscopy;  Laterality: N/A;   ESOPHAGOGASTRODUODENOSCOPY N/A 06/22/2022   Procedure: ESOPHAGOGASTRODUODENOSCOPY (EGD);  Surgeon: Midge Minium, MD;  Location: [facility] ENDOSCOPY;  Service: Endoscopy;  Laterality: N/A;   TONSILLECTOMY  ~1994/95    Family History  Problem Relation Age of Onset   Crohn's disease Mother    Alcohol abuse Father    Drug abuse Father    Crohn's disease  Maternal Aunt    CAD Maternal Grandmother    Cancer Neg Hx    Diabetes Neg Hx     Social History   Socioeconomic History   Marital status: Single    Spouse name: Not on file   Number of children: Not on file   Years of education: Not on file   Highest education level: Not on file  Occupational History   Not on file  Tobacco Use   Smoking status: Former    Current packs/day: 1.00    Average packs/day: 1 pack/day for 20.7 years (20.7 ttl pk-yrs)    Types: Cigarettes    Start date: 06/05/2002   Smokeless tobacco: Never  Vaping Use   Vaping status: Every Day  Substance and Sexual Activity   Alcohol use: No    Alcohol/week: 0.0 standard drinks of alcohol   Drug use: Not Currently    Types: Heroin    Comment: h/o heroine use   Sexual activity: Yes    Birth control/protection: Injection  Other Topics Concern   Not on file  Social History Narrative   Lives with mom and boyfriend, 1 dog, 6 cats   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[employer]   Activity: no regular exercise - remodeling house   Diet: some water, vegetables regularly   Social Determinants of Health   Financial Resource Strain: Not on file  Food Insecurity: Not on file  Transportation Needs: No Transportation Needs (04/22/2020)   PRAPARE - Administrator, Civil Service (Medical): No    Lack of Transportation (Non-Medical): No  Physical Activity:  Not on file  Stress: Not on file  Social Connections: Not on file  Intimate Partner Violence: Not on file     Current Outpatient Medications:    acetaminophen (TYLENOL) 325 MG tablet, Take 325 mg by mouth as needed., Disp: , Rfl:    diphenhydrAMINE (BENADRYL) 25 MG tablet, Take 25 mg by mouth as needed., Disp: , Rfl:    escitalopram (LEXAPRO) 10 MG tablet, Lexapro 10 MG Oral Tablet, Disp: , Rfl:    medroxyPROGESTERone (DEPO-PROVERA) 150 MG/ML injection, Inject 1 mL (150 mg total) into the muscle every 3 (three) months., Disp: 1 each, Rfl: 2   omeprazole (PRILOSEC) 40 MG  capsule, Take 1 capsule (40 mg total) by mouth daily., Disp: 90 capsule, Rfl: 3   Vitamin D, Ergocalciferol, (DRISDOL) 1.25 MG (50000 UNIT) CAPS capsule, Take 50,000 Units by mouth once a week. 1 Capsule(s) By Mouth Once a Week, Disp: , Rfl:      ROS:  Review of Systems BREAST: No symptoms   Objective: There were no vitals taken for this visit.   OBGyn Exam  Results: No results found for this or any previous visit (from the past 24 hour(s)).  Assessment/Plan: No diagnosis found.  No orders of the defined types were placed in this encounter.            GYN counsel {counseling: 16159}     F/U  No follow-ups on file.  Tarisha Fader B. Varvara Legault, PA-C 02/05/2023 7:22 PM

## 2023-02-06 ENCOUNTER — Encounter: Payer: Self-pay | Admitting: Obstetrics and Gynecology

## 2023-02-06 ENCOUNTER — Ambulatory Visit (INDEPENDENT_AMBULATORY_CARE_PROVIDER_SITE_OTHER): Payer: 59 | Admitting: Obstetrics and Gynecology

## 2023-02-06 VITALS — BP 90/68 | Ht 59.0 in | Wt 106.0 lb

## 2023-02-06 DIAGNOSIS — Z3042 Encounter for surveillance of injectable contraceptive: Secondary | ICD-10-CM

## 2023-02-06 DIAGNOSIS — Z01419 Encounter for gynecological examination (general) (routine) without abnormal findings: Secondary | ICD-10-CM

## 2023-02-06 MED ORDER — MEDROXYPROGESTERONE ACETATE 150 MG/ML IM SUSY: 150.0000 mg | PREFILLED_SYRINGE | INTRAMUSCULAR | Status: AC

## 2023-02-06 NOTE — Patient Instructions (Signed)
I value your feedback and you entrusting us with your care. If you get a [facility] patient survey, I would appreciate you taking the time to let us know about your experience today. Thank you! ? ? ?

## 2023-02-07 ENCOUNTER — Emergency Department: Payer: 59

## 2023-02-07 ENCOUNTER — Other Ambulatory Visit: Payer: Self-pay

## 2023-02-07 ENCOUNTER — Emergency Department
Admission: EM | Admit: 2023-02-07 | Discharge: 2023-02-07 | Disposition: A | Discharge status: Home / Self Care | Payer: 59 | Attending: Emergency Medicine | Admitting: Emergency Medicine

## 2023-02-07 DIAGNOSIS — W228XXA Striking against or struck by other objects, initial encounter: Secondary | ICD-10-CM | POA: Diagnosis not present

## 2023-02-07 DIAGNOSIS — S86912A Strain of unspecified muscle(s) and tendon(s) at lower leg level, left leg, initial encounter: Secondary | ICD-10-CM | POA: Diagnosis not present

## 2023-02-07 DIAGNOSIS — M79662 Pain in left lower leg: Secondary | ICD-10-CM | POA: Diagnosis present

## 2023-02-07 DIAGNOSIS — S86812A Strain of other muscle(s) and tendon(s) at lower leg level, left leg, initial encounter: Secondary | ICD-10-CM

## 2023-02-07 MED ORDER — KETOROLAC TROMETHAMINE 30 MG/ML IJ SOLN
30.0000 mg | Freq: Once | INTRAMUSCULAR | Status: AC
  Administered 2023-02-07: 30 mg via INTRAMUSCULAR
  Filled 2023-02-07: qty 1

## 2023-02-07 MED ORDER — MELOXICAM 15 MG PO TABS
15.0000 mg | ORAL_TABLET | Freq: Every day | ORAL | 0 refills | Status: DC
Start: 2023-02-07 — End: 2023-12-31

## 2023-02-07 NOTE — ED Triage Notes (Signed)
Pt to ED for left leg pain since Sunday after accidentally kicking wall while trying to kill a bug. Reports pain to entire left leg with bruise to back of leg. Ambulatory to triage.

## 2023-02-07 NOTE — ED Provider Notes (Signed)
[facility] Provider Note  Patient Contact: 12:05 PM (approximate)   History   Leg Pain   HPI  Sonya Finley is a 37 y.o. female who presents emergency department complaining of left lower leg pain.  Patient states there was a 4 spine her house, it was on the wall and she tried to kick it.  Patient states that she went foot first into the wall 2-3 times.  Patient has since had pain to the distal tib-fib region with now bruising and pain in her calf muscle.  No other injury or complaint.  No medications prior to arrival.  Patient is still ambulatory.  Injury occurred 4 days ago.     Physical Exam   Triage Vital Signs: ED Triage Vitals [02/07/23 1015]  Encounter Vitals Group     BP (!) 120/90     Systolic BP Percentile      Diastolic BP Percentile      Pulse Rate 92     Resp 16     Temp 98.4 F (36.9 C)     Temp src      SpO2 98 %     Weight 106 lb (48.1 kg)     Height 4\' 11"  (1.499 m)     Head Circumference      Peak Flow      Pain Score 7     Pain Loc      Pain Education      Exclude from Growth Chart     Most recent vital signs: Vitals:   02/07/23 1015 02/07/23 1435  BP: (!) 120/90 127/85  Pulse: 92 90  Resp: 16 18  Temp: 98.4 F (36.9 C) 98.4 F (36.9 C)  SpO2: 98% 96%     General: Alert and in no acute distress.  Cardiovascular:  Good peripheral perfusion Respiratory: Normal respiratory effort without tachypnea or retractions. Lungs CTAB.  Musculoskeletal: Full range of motion to all extremities.  Visualization of the left leg reveals mild bruising and edema about the inferior gastrocnemius muscle range.  Tender along the Achilles tendon without palpable deficit.  Full range of motion to the ankle and knee.  Pulses sensation intact to left lower extremity. Neurologic:  No gross focal neurologic deficits are appreciated.  Skin:   No rash noted Other:   ED Results / Procedures / Treatments   Labs (all labs ordered are  listed, but only abnormal results are displayed) Labs Reviewed - No data to display   EKG     RADIOLOGY  I personally viewed, evaluated, and interpreted these images as part of my medical decision making, as well as reviewing the written report by the radiologist.  ED Provider Interpretation: No acute DVT or fracture identified on imaging  US Venous Img Lower Unilateral Left  Result Date: 02/07/2023 CLINICAL DATA:  37 year old female with a history of calf pain and edema EXAM: LEFT LOWER EXTREMITY VENOUS DOPPLER ULTRASOUND TECHNIQUE: Gray-scale sonography with graded compression, as well as color Doppler and duplex ultrasound were performed to evaluate the lower extremity deep venous systems from the level of the common femoral vein and including the common femoral, femoral, profunda femoral, popliteal and calf veins including the posterior tibial, peroneal and gastrocnemius veins when visible. The superficial great saphenous vein was also interrogated. Spectral Doppler was utilized to evaluate flow at rest and with distal augmentation maneuvers in the common femoral, femoral and popliteal veins. COMPARISON:  None Available. FINDINGS: Contralateral Common Femoral Vein: Respiratory phasicity  is normal and symmetric with the symptomatic side. No evidence of thrombus. Normal compressibility. Common Femoral Vein: No evidence of thrombus. Normal compressibility, respiratory phasicity and response to augmentation. Saphenofemoral Junction: No evidence of thrombus. Normal compressibility and flow on color Doppler imaging. Profunda Femoral Vein: No evidence of thrombus. Normal compressibility and flow on color Doppler imaging. Femoral Vein: No evidence of thrombus. Normal compressibility, respiratory phasicity and response to augmentation. Popliteal Vein: No evidence of thrombus. Normal compressibility, respiratory phasicity and response to augmentation. Calf Veins: No evidence of thrombus. Normal  compressibility and flow on color Doppler imaging. Superficial Great Saphenous Vein: No evidence of thrombus. Normal compressibility and flow on color Doppler imaging. Other Findings:  None. IMPRESSION: Directed duplex of the left lower extremity negative for DVT Signed, Yvone Neu. Miachel Roux, RPVI [facility] Radiology Electronically Signed   By: Gilmer Mor D.O.   On: 02/07/2023 13:54   DG Tibia/Fibula Left  Result Date: 02/07/2023 CLINICAL DATA:  Left lower leg pain status post kicking a wall multiple times trying to kill a fly EXAM: LEFT TIBIA AND FIBULA - 2 VIEW COMPARISON:  None available FINDINGS: No fracture, dislocation, or soft tissue abnormality. IMPRESSION: No acute abnormality of the left lower leg. Electronically Signed   By: Acquanetta Belling M.D.   On: 02/07/2023 12:35    PROCEDURES:  Critical Care performed: No  Procedures   MEDICATIONS ORDERED IN ED: Medications  ketorolac (TORADOL) 30 MG/ML injection 30 mg (has no administration in time range)     IMPRESSION / MDM / ASSESSMENT AND PLAN / ED COURSE  I reviewed the triage vital signs and the nursing notes.                                 Differential diagnosis includes, but is not limited to, gastrocnemius muscle strain, contusion, fracture, DVT   Patient's presentation is most consistent with acute presentation with potential threat to life or bodily function.   Patient's diagnosis is consistent with gastroc strain.  Patient presents emergency department after kicking a wall while trying to kill an insect.  Patient had bruising and swelling along the inferior portion of her gastroc muscle.  There is no evidence of deficit concerning for full-thickness tear.  Achilles tendon is intact.  Imaging revealed no rupture or evidence of DVT.  Patiently placed on anti-inflammatories and given a boot for symptom control.  Follow-up with orthopedics.. Patient is given ED  precautions to return to the ED for any worsening or new symptoms.     FINAL CLINICAL IMPRESSION(S) / ED DIAGNOSES   Final diagnoses:  Strain of calf muscle, left, initial encounter     Rx / DC Orders   ED Discharge Orders          Ordered    meloxicam (MOBIC) 15 MG tablet  Daily        02/07/23 1508             Note:  This document was prepared using Dragon voice recognition software and may include unintentional dictation errors.   Lanette Hampshire 02/07/23 1509    Shaune Pollack, MD 02/07/23 [id number]

## 2023-02-07 NOTE — ED Notes (Signed)
See triage notes. Patient stated she was attempting to kill a horse fly on Sunday which resulted in her kicking the wall multiple times with her left foot. Patient c/o pain in the left leg

## 2023-02-08 ENCOUNTER — Inpatient Hospital Stay: Payer: 59 | Admitting: Oncology

## 2023-02-08 ENCOUNTER — Inpatient Hospital Stay: Payer: 59

## 2023-02-14 ENCOUNTER — Other Ambulatory Visit: Payer: Self-pay

## 2023-02-14 DIAGNOSIS — E538 Deficiency of other specified B group vitamins: Secondary | ICD-10-CM

## 2023-02-14 DIAGNOSIS — D509 Iron deficiency anemia, unspecified: Secondary | ICD-10-CM

## 2023-02-15 ENCOUNTER — Inpatient Hospital Stay (HOSPITAL_BASED_OUTPATIENT_CLINIC_OR_DEPARTMENT_OTHER): Payer: 59 | Admitting: Oncology

## 2023-02-15 ENCOUNTER — Encounter: Payer: Self-pay | Admitting: Oncology

## 2023-02-15 ENCOUNTER — Inpatient Hospital Stay: Payer: 59 | Attending: Oncology

## 2023-02-15 VITALS — BP 103/81 | HR 79 | Temp 98.3°F | Resp 18 | Wt 110.3 lb

## 2023-02-15 DIAGNOSIS — D500 Iron deficiency anemia secondary to blood loss (chronic): Secondary | ICD-10-CM

## 2023-02-15 DIAGNOSIS — D509 Iron deficiency anemia, unspecified: Secondary | ICD-10-CM | POA: Diagnosis present

## 2023-02-15 DIAGNOSIS — E538 Deficiency of other specified B group vitamins: Secondary | ICD-10-CM | POA: Diagnosis not present

## 2023-02-15 LAB — CBC WITH DIFFERENTIAL (CANCER CENTER ONLY)
Abs Immature Granulocytes: 0.02 10*3/uL (ref 0.00–0.07)
Basophils Absolute: 0.1 10*3/uL (ref 0.0–0.1)
Basophils Relative: 1 %
Eosinophils Absolute: 0.2 10*3/uL (ref 0.0–0.5)
Eosinophils Relative: 4 %
HCT: 38.3 % (ref 36.0–46.0)
Hemoglobin: 12.6 g/dL (ref 12.0–15.0)
Immature Granulocytes: 0 %
Lymphocytes Relative: 36 %
Lymphs Abs: 1.9 10*3/uL (ref 0.7–4.0)
MCH: 29.4 pg (ref 26.0–34.0)
MCHC: 32.9 g/dL (ref 30.0–36.0)
MCV: 89.3 fL (ref 80.0–100.0)
Monocytes Absolute: 0.3 10*3/uL (ref 0.1–1.0)
Monocytes Relative: 6 %
Neutro Abs: 2.7 10*3/uL (ref 1.7–7.7)
Neutrophils Relative %: 53 %
Platelet Count: 267 10*3/uL (ref 150–400)
RBC: 4.29 MIL/uL (ref 3.87–5.11)
RDW: 12.4 % (ref 11.5–15.5)
WBC Count: 5.2 10*3/uL (ref 4.0–10.5)
nRBC: 0 % (ref 0.0–0.2)

## 2023-02-15 LAB — IRON AND TIBC
Iron: 93 ug/dL (ref 28–170)
Saturation Ratios: 23 % (ref 10.4–31.8)
TIBC: 398 ug/dL (ref 250–450)
UIBC: 305 ug/dL

## 2023-02-15 LAB — FERRITIN: Ferritin: 20 ng/mL (ref 11–307)

## 2023-02-15 LAB — VITAMIN B12: Vitamin B-12: 218 pg/mL (ref 180–914)

## 2023-02-15 NOTE — Progress Notes (Signed)
Patient here for follow up. No new concerns voiced.  °

## 2023-02-15 NOTE — Assessment & Plan Note (Addendum)
Labs are reviewed and discussed with patient. Lab Results  Component Value Date   HGB 12.6 02/15/2023   TIBC 398 02/15/2023   IRONPCTSAT 23 02/15/2023   FERRITIN 20 02/15/2023   Normal hemoglobin and iron panel  No need for iron supplementation or iron infusion.

## 2023-02-15 NOTE — Progress Notes (Signed)
Hematology/Oncology Progress note [phone] [phone]      Patient Care Team: Margaretann Loveless, MD as PCP - General (Internal Medicine) Rickard Patience, MD as Consulting Physician (Oncology)   CHIEF COMPLAINTS/REASON FOR VISIT:   iron deficiency anemia.  ASSESSMENT & PLAN:   Iron deficiency anemia secondary to blood loss (chronic) Labs are reviewed and discussed with patient. Lab Results  Component Value Date   HGB 12.6 02/15/2023   TIBC 398 02/15/2023   IRONPCTSAT 23 02/15/2023   FERRITIN 20 02/15/2023   Normal hemoglobin and iron panel  No need for iron supplementation or iron infusion.    Patient is discharged from my clinic. I recommend patient to continue follow up with primary care physician. Patient may re-establish care in the future if clinically indicated.   All questions were answered. The patient knows to call the clinic with any problems, questions or concerns.  Rickard Patience, MD, PhD [facility] Hematology Oncology 02/15/2023   HISTORY OF PRESENTING ILLNESS:   Sonya Finley is a  37 y.o.  female with PMH listed below was seen in consultation at the request of  Margaretann Loveless, MD  for evaluation of iron deficiency anemia Reviewed blood work patient had done with primary care provider  03/29/2021, creatinine 0.72, WBC 6.3, hemoglobin 9.2, MCV 74, hematocrit 30.6, platelet 360 normal differential.  Urine negative for occult blood.  Iron saturation 3, ferritin 7, TIBC 427.  Patient reports that her menstrual period is not heavy.  Usually 4 to 5 days. Patient has family history of Crohn's disease.  Patient uses ibuprofen 1-2 times daily. Reports feeling fatigued and tired.  No pica symptoms.  She denies any bloody bowel movement or melena.  Denies any epigastric discomfort.  INTERVAL HISTORY Isaiah Laporta is a 37 y.o. female who has above history reviewed by me today presents for follow up visit for management of iron deficiency  anemia. Fatigue has improved and she feels well. She is on Depo shot. Occasional NSAID use.  Review of Systems  Constitutional:  Negative for appetite change, chills, fatigue and fever.  HENT:   Negative for hearing loss and voice change.   Eyes:  Negative for eye problems.  Respiratory:  Negative for chest tightness and cough.   Cardiovascular:  Negative for chest pain.  Gastrointestinal:  Negative for abdominal distention, abdominal pain and blood in stool.  Endocrine: Negative for hot flashes.  Genitourinary:  Negative for difficulty urinating and frequency.   Musculoskeletal:  Negative for arthralgias.  Skin:  Negative for itching and rash.  Neurological:  Negative for extremity weakness.  Hematological:  Negative for adenopathy.  Psychiatric/Behavioral:  Negative for confusion.     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, on Methadone by [facility]   History of migraine more as teenager   History of pyelonephritis 2013   History of UTI    last 2015   Hx of viral meningitis ~1995/96   hospitalized for 1 wk   Seasonal allergies     SURGICAL HISTORY: Past Surgical History:  Procedure Laterality Date   COLONOSCOPY WITH PROPOFOL N/A 06/22/2022   Procedure: COLONOSCOPY WITH PROPOFOL;  Surgeon: Midge Minium, MD;  Location: [facility] ENDOSCOPY;  Service: Endoscopy;  Laterality: N/A;   ESOPHAGOGASTRODUODENOSCOPY N/A 06/22/2022   Procedure: ESOPHAGOGASTRODUODENOSCOPY (EGD);  Surgeon: Midge Minium, MD;  Location: [facility]  ENDOSCOPY;  Service: Endoscopy;  Laterality: N/A;   TONSILLECTOMY  ~1994/95    SOCIAL HISTORY: Social History   Socioeconomic History   Marital status: Single    Spouse name: Not on file   Number of children: Not on file   Years of education: Not on file   Highest education level: Not on file  Occupational History   Not on file   Tobacco Use   Smoking status: Former    Current packs/day: 1.00    Average packs/day: 1 pack/day for 20.7 years (20.7 ttl pk-yrs)    Types: Cigarettes    Start date: 06/05/2002   Smokeless tobacco: Never  Vaping Use   Vaping status: Every Day  Substance and Sexual Activity   Alcohol use: Yes    Comment: occ   Drug use: Not Currently    Types: Heroin    Comment: h/o heroine use   Sexual activity: Yes    Birth control/protection: Injection  Other Topics Concern   Not on file  Social History Narrative   Lives with mom and boyfriend, 1 dog, 6 cats   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[employer]   Activity: no regular exercise - remodeling house   Diet: some water, vegetables regularly   Social Determinants of Health   Financial Resource Strain: Not on file  Food Insecurity: Not on file  Transportation Needs: No Transportation Needs (04/22/2020)   PRAPARE - Administrator, Civil Service (Medical): No    Lack of Transportation (Non-Medical): No  Physical Activity: Not on file  Stress: Not on file  Social Connections: Not on file  Intimate Partner Violence: Not on file    FAMILY HISTORY: Family History  Problem Relation Age of Onset   Crohn's disease Mother    Alcohol abuse Father    Drug abuse Father    Crohn's disease Maternal Aunt    CAD Maternal Grandmother    Cancer Neg Hx    Diabetes Neg Hx     ALLERGIES:  has No Known Allergies.  MEDICATIONS:  Current Outpatient Medications  Medication Sig Dispense Refill   acetaminophen (TYLENOL) 325 MG tablet Take 325 mg by mouth as needed.     escitalopram (LEXAPRO) 10 MG tablet Lexapro 10 MG Oral Tablet     medroxyPROGESTERone (DEPO-PROVERA) 150 MG/ML injection Inject 1 mL (150 mg total) into the muscle every 3 (three) months. 1 each 2   meloxicam (MOBIC) 15 MG tablet Take 1 tablet (15 mg total) by mouth daily. 30 tablet 0   omeprazole (PRILOSEC) 40 MG capsule Take 1 capsule (40 mg total) by mouth daily. 90 capsule 3   Vitamin  D, Ergocalciferol, (DRISDOL) 1.25 MG (50000 UNIT) CAPS capsule Take 50,000 Units by mouth once a week. Once a month.     Current Facility-Administered Medications  Medication Dose Route Frequency Provider Last Rate Last Admin   [START ON 03/12/2023] medroxyPROGESTERone Acetate SUSY 150 mg  150 mg Intramuscular Q90 days          PHYSICAL EXAMINATION: ECOG PERFORMANCE STATUS: 0 - Asymptomatic Vitals:   02/15/23 0958  BP: 103/81  Pulse: 79  Resp: 18  Temp: 98.3 F (36.8 C)   Filed Weights   02/15/23 0958  Weight: 110 lb 4.8 oz (50 kg)   Physical Exam Constitutional:      Appearance: Normal appearance.  HENT:     Head: Normocephalic and atraumatic.  Eyes:     General: No scleral icterus. Cardiovascular:     Rate  and Rhythm: Normal rate.  Pulmonary:     Effort: Pulmonary effort is normal. No respiratory distress.  Abdominal:     General: There is no distension.  Musculoskeletal:        General: Normal range of motion.  Skin:    Findings: No rash.  Neurological:     General: No focal deficit present.     Mental Status: She is alert and oriented to person, place, and time.  Psychiatric:        Mood and Affect: Mood normal.      LABORATORY DATA:  I have reviewed the data as listed Lab Results  Component Value Date   WBC 5.2 02/15/2023   HGB 12.6 02/15/2023   HCT 38.3 02/15/2023   MCV 89.3 02/15/2023   PLT 267 02/15/2023   Recent Labs    06/09/22 1734 07/13/22 1045 01/19/23 1151  NA 138 142 142  K 4.4 4.1 4.2  CL 107 106 107*  CO2 24 22 22   GLUCOSE 90 67* 69*  BUN 5* 6 6  CREATININE 0.82 0.86 0.93  CALCIUM 9.1 8.8 9.3  GFRNONAA >60  --   --   PROT  --  6.4 6.7  ALBUMIN  --  4.0 4.3  AST  --  35 22  ALT  --  46* 25  ALKPHOS  --  68 61  BILITOT  --  <0.2 0.3   Iron/TIBC/Ferritin/ %Sat    Component Value Date/Time   IRON 93 02/15/2023 0946   TIBC 398 02/15/2023 0946   FERRITIN 20 02/15/2023 0946   FERRITIN 25 07/13/2022 1045   IRONPCTSAT 23  02/15/2023 0946       RADIOGRAPHIC STUDIES: I have personally reviewed the radiological images as listed and agreed with the findings in the report. US Venous Img Lower Unilateral Left  Result Date: 02/07/2023 CLINICAL DATA:  37 year old female with a history of calf pain and edema EXAM: LEFT LOWER EXTREMITY VENOUS DOPPLER ULTRASOUND TECHNIQUE: Gray-scale sonography with graded compression, as well as color Doppler and duplex ultrasound were performed to evaluate the lower extremity deep venous systems from the level of the common femoral vein and including the common femoral, femoral, profunda femoral, popliteal and calf veins including the posterior tibial, peroneal and gastrocnemius veins when visible. The superficial great saphenous vein was also interrogated. Spectral Doppler was utilized to evaluate flow at rest and with distal augmentation maneuvers in the common femoral, femoral and popliteal veins. COMPARISON:  None Available. FINDINGS: Contralateral Common Femoral Vein: Respiratory phasicity is normal and symmetric with the symptomatic side. No evidence of thrombus. Normal compressibility. Common Femoral Vein: No evidence of thrombus. Normal compressibility, respiratory phasicity and response to augmentation. Saphenofemoral Junction: No evidence of thrombus. Normal compressibility and flow on color Doppler imaging. Profunda Femoral Vein: No evidence of thrombus. Normal compressibility and flow on color Doppler imaging. Femoral Vein: No evidence of thrombus. Normal compressibility, respiratory phasicity and response to augmentation. Popliteal Vein: No evidence of thrombus. Normal compressibility, respiratory phasicity and response to augmentation. Calf Veins: No evidence of thrombus. Normal compressibility and flow on color Doppler imaging. Superficial Great Saphenous Vein: No evidence of thrombus. Normal compressibility and flow on color Doppler imaging. Other Findings:  None. IMPRESSION: Directed  duplex of the left lower extremity negative for DVT Signed, Yvone Neu. Miachel Roux, RPVI [facility] Electronically Signed   By: Gilmer Mor D.O.   On: 02/07/2023 13:54   DG Tibia/Fibula Left  Result  Date: 02/07/2023 CLINICAL DATA:  Left lower leg pain status post kicking a wall multiple times trying to kill a fly EXAM: LEFT TIBIA AND FIBULA - 2 VIEW COMPARISON:  None available FINDINGS: No fracture, dislocation, or soft tissue abnormality. IMPRESSION: No acute abnormality of the left lower leg. Electronically Signed   By: Acquanetta Belling M.D.   On: 02/07/2023 12:35

## 2023-03-20 ENCOUNTER — Encounter: Payer: Self-pay | Admitting: Oncology

## 2023-03-30 ENCOUNTER — Telehealth: Payer: Self-pay

## 2023-03-30 NOTE — Telephone Encounter (Signed)
Left voicemail advising due to office schedule change, requesting patient to arrive a 8:45 instead of 9:15. Asked that she call back or send my chart message to verify.

## 2023-04-04 ENCOUNTER — Ambulatory Visit: Payer: Self-pay

## 2023-07-16 ENCOUNTER — Encounter: Payer: Self-pay | Admitting: Oncology

## 2023-07-23 ENCOUNTER — Ambulatory Visit: Payer: Self-pay | Admitting: Internal Medicine

## 2023-12-04 ENCOUNTER — Other Ambulatory Visit: Payer: Self-pay

## 2023-12-04 ENCOUNTER — Emergency Department
Admission: EM | Admit: 2023-12-04 | Discharge: 2023-12-04 | Disposition: A | Discharge status: Home / Self Care | Payer: Self-pay | Attending: Emergency Medicine | Admitting: Emergency Medicine

## 2023-12-04 ENCOUNTER — Encounter: Payer: Self-pay | Admitting: Emergency Medicine

## 2023-12-04 DIAGNOSIS — N6452 Nipple discharge: Secondary | ICD-10-CM

## 2023-12-04 DIAGNOSIS — N644 Mastodynia: Secondary | ICD-10-CM | POA: Insufficient documentation

## 2023-12-04 NOTE — ED Notes (Signed)
 Pt is CAOx4, breathing normally, and normal in color. Pt is complaining of right nipple discharge (possibly blood) that started yesterday. Pt denies any trauma to the area and denies every having this happen before. Pt is in NAD at this time and denies any needs.

## 2023-12-04 NOTE — ED Triage Notes (Signed)
 Patient to ED via POV for left nipple bleeding. Started yesterday with soreness. Denies injury.

## 2023-12-04 NOTE — ED Notes (Signed)
 Accompanied PA Dougherty in the room for breast exam.

## 2023-12-04 NOTE — Discharge Instructions (Addendum)
 Please reach out to the breast center to schedule a mammogram. I have also placed a referral to a breast specialist, keep an eye out for a phone call in regards to this.  [facility] [address], [place], [place] [address] [phone]  You can take 650 mg of Tylenol  and 600 mg of ibuprofen  every 6 hours as needed for pain. You can use ice, heat, muscle creams and other topical pain relievers as well.  Return to the ED with any worsening symptoms.

## 2023-12-04 NOTE — ED Provider Notes (Signed)
 [facility] Provider Note    Event Date/Time   First MD Initiated Contact with Patient 12/04/23 1001     (approximate)   History   Breast Pain   HPI  Sonya Finley is a 38 y.o. female with PMH of IDA presents for evaluation of left breast pain and bleeding from her left nipple. Patient noticed this yesterday, states she had a small amount of dried blood on her bra. She has had mild pain to the left breast as well.  She has not had any cuts, abrasions or trauma to the left breast.  Denies unintentional weight loss and night sweats.  No history of breast cancer in her family.      Physical Exam   Triage Vital Signs: ED Triage Vitals  Encounter Vitals Group     BP 12/04/23 0959 124/79     Girls Systolic BP Percentile --      Girls Diastolic BP Percentile --      Boys Systolic BP Percentile --      Boys Diastolic BP Percentile --      Pulse Rate 12/04/23 0959 95     Resp 12/04/23 0959 19     Temp 12/04/23 0959 98.7 F (37.1 C)     Temp Source 12/04/23 0959 Oral     SpO2 12/04/23 0959 95 %     Weight 12/04/23 0958 103 lb (46.7 kg)     Height 12/04/23 0958 4' 11 (1.499 m)     Head Circumference --      Peak Flow --      Pain Score 12/04/23 0958 5     Pain Loc --      Pain Education --      Exclude from Growth Chart --     Most recent vital signs: Vitals:   12/04/23 0959  BP: 124/79  Pulse: 95  Resp: 19  Temp: 98.7 F (37.1 C)  SpO2: 95%   General: Awake, no distress.  CV:  Good peripheral perfusion.  Resp:  Normal effort.  Abd:  No distention.  Breast:  Left and right breast are symmetrical, no nodules felt on palpation of the left breast, small amount of dried blood on the left nipple, mild tenderness to palpation of the left breast.   ED Results / Procedures / Treatments   Labs (all labs ordered are listed, but only abnormal results are displayed) Labs Reviewed - No data to display   PROCEDURES:  Critical Care  performed: No  Procedures   MEDICATIONS ORDERED IN ED: Medications - No data to display   IMPRESSION / MDM / ASSESSMENT AND PLAN / ED COURSE  I reviewed the triage vital signs and the nursing notes.                             38 year old female presents for evaluation of bleeding from left nipple.  Vital signs are stable patient NAD on exam.  Differential diagnosis includes, but is not limited to, abscess, papilloma, ductal ectasia, breast cancer.  Patient's presentation is most consistent with acute, uncomplicated illness.  Patient does have small amount of dried blood on the left nipple but no palpable nodules noted.  Recommend that she take Tylenol  ibuprofen  as needed for pain.  Emphasized the importance of following up to have a mammogram as the symptoms may be attributable to breast cancer.  Also placed a referral for a breast specialist. Patient  was provided with contact information for imaging follow-up.  She voiced understanding, all questions were answered and she was stable at discharge.      FINAL CLINICAL IMPRESSION(S) / ED DIAGNOSES   Final diagnoses:  Breast pain, left     Rx / DC Orders   ED Discharge Orders          Ordered    Ambulatory Referral to Breast Specialist        12/04/23 1044             Note:  This document was prepared using Dragon voice recognition software and may include unintentional dictation errors.   Cleaster Tinnie LABOR, PA-C 12/04/23 1047    Levander Slate, MD 12/04/23 [phone]

## 2023-12-31 ENCOUNTER — Ambulatory Visit: Payer: Self-pay | Attending: Obstetrics and Gynecology | Admitting: *Deleted

## 2023-12-31 ENCOUNTER — Ambulatory Visit
Admission: RE | Admit: 2023-12-31 | Discharge: 2023-12-31 | Disposition: A | Discharge status: Home / Self Care | Payer: Self-pay | Source: Ambulatory Visit | Attending: Obstetrics and Gynecology | Admitting: Obstetrics and Gynecology

## 2023-12-31 VITALS — BP 132/95 | Wt 96.0 lb

## 2023-12-31 DIAGNOSIS — N6311 Unspecified lump in the right breast, upper outer quadrant: Secondary | ICD-10-CM

## 2023-12-31 DIAGNOSIS — N6452 Nipple discharge: Secondary | ICD-10-CM

## 2023-12-31 DIAGNOSIS — N644 Mastodynia: Secondary | ICD-10-CM

## 2023-12-31 DIAGNOSIS — Z1239 Encounter for other screening for malignant neoplasm of breast: Secondary | ICD-10-CM

## 2023-12-31 DIAGNOSIS — N6325 Unspecified lump in the left breast, overlapping quadrants: Secondary | ICD-10-CM

## 2023-12-31 NOTE — Progress Notes (Signed)
 Ms. Sonya Finley is a 38 y.o. female who presents to [facility] clinic today with complaint of spontaneous left bloody nipple discharge since 12/02/2023 and left outer breast lump since 12/06/2023. Patient complained of left breast pain that is constant. Patient rates the pain at a 5 out of 10..    Pap Smear: Pap smear not completed today. Last Pap smear was 02/02/2022 at [facility] clinic and was normal. Per patient has no history of an abnormal Pap smear. Last Pap smear result is available in Epic.   Physical exam: Breasts Breasts symmetrical. No skin abnormalities bilateral breasts. No nipple retraction bilateral breasts. No nipple discharge bilateral breasts. Unable to express any nipple discharge on exam. No lymphadenopathy. No lumps palpated bilateral breasts. Palpated a lump within the right breast at 10 o'clock 3 cm from the nipple. Palpated a pea sized lump within the left breast at 12 o'clock above the nipple. Complaints of left outer breast and nipple area pain on exam.    MS 3D DIAG MAMMO BILAT BR (aka MM) Result Date: 12/31/2023 CLINICAL DATA:  Patient reports several episodes of bloody LEFT nipple discharge, most recently a few weeks ago. Patient also reports LEFT focal breast pain. EXAM: DIGITAL DIAGNOSTIC BILATERAL MAMMOGRAM WITH TOMOSYNTHESIS AND CAD; ULTRASOUND LEFT BREAST LIMITED TECHNIQUE: Bilateral digital diagnostic mammography and breast tomosynthesis was performed. The images were evaluated with computer-aided detection. ; Targeted ultrasound examination of the left breast was performed. COMPARISON:  Previous exam(s). ACR Breast Density Category d: The breasts are extremely dense, which lowers the sensitivity of mammography. FINDINGS: Spot compression tomosynthesis views were obtained over the area of focal pain in the LEFT breast. No suspicious mammographic finding is identified in this area. Spot compression tomosynthesis views were obtained of the LEFT retroareolar breast. No  suspicious mammographic finding is noted in this area. No suspicious mass, microcalcification, or other finding is identified in either breast. On physical exam, no suspicious mass is appreciated. Targeted LEFT breast ultrasound was performed in the area of pain at the upper outer breast. No suspicious solid or cystic mass is identified. Targeted LEFT retroareolar ultrasound was performed. No suspicious cystic or solid mass is seen. No discrete intraductal mass is noted. IMPRESSION: 1. No mammographic or sonographic etiology for bloody LEFT nipple discharge is identified. Given unilateral bloody symptomatology, a dedicated bilateral breast MRI with and without contrast would be recommended for further evaluation. 2. No mammographic or sonographic evidence of malignancy at the site of painful concern in the LEFT breast. Any further workup of the patient's symptoms should be based on the clinical assessment. 3. No mammographic evidence of malignancy bilaterally. 4. Breast pain is a common condition, which will often resolve on its own without intervention. It can be affected by hormonal changes, medication side effect, weight changes and fit of the bra. Pain may also be referred from other adjacent areas of the body. Breast pain may be improved by wearing adequate well-fitting support, over-the-counter topical and oral NSAID medication, low-fat diet, and ice/heat as needed. Studies have shown an improvement in cyclic pain with use of evening primrose oil, vitamin D  and vitamin E. Clinical follow-up recommended to discuss any further work-up recommendations and appropriate treatment. RECOMMENDATION: 1. Bilateral breast MRI with and without contrast is recommended. I have discussed the findings and recommendations with the patient. If applicable, a reminder letter will be sent to the patient regarding the next appointment. BI-RADS CATEGORY  1: Negative. Electronically Signed   By: Corean Salter M.D.  On:  12/31/2023 16:08   MS DIGITAL DIAG TOMO BILAT Result Date: 04/22/2020 CLINICAL DATA:  38 year old presenting with a possible palpable lump in the INNER LEFT breast. This is the patient's initial baseline mammogram. Patient states no family history of breast cancer. EXAM: DIGITAL DIAGNOSTIC BILATERAL MAMMOGRAM WITH CAD AND TOMO ULTRASOUND LEFT BREAST COMPARISON:  None. ACR Breast Density Category d: The breast tissue is extremely dense, which lowers the sensitivity of mammography. FINDINGS: Tomosynthesis and synthesized full field CC and MLO views of both breasts were obtained. Tomosynthesis and synthesized spot compression tangential view of the area of concern in the LEFT breast was also obtained. Mammographic images were processed with CAD. LEFT: No mammographic abnormality in the area of palpable concern in the INNER breast. Normal dense fibroglandular tissue is present in this location. A circumscribed isodense mass is present in the UPPER OUTER QUADRANT at POSTERIOR depth without associated architectural distortion or suspicious calcifications. No suspicious findings elsewhere. Targeted LEFT breast ultrasound is performed, showing an oval parallel nearly anechoic mass with scattered internal echoes at the 9 o'clock position approximately 5 cm from the nipple at POSTERIOR depth measuring approximately 6 x 3 x 6 mm, demonstrating posterior acoustic enhancement and no internal power Doppler flow, corresponding to the area of palpable concern. At the 2 o'clock position approximately 4 cm from the nipple is a benign simple cyst measuring approximately 10 x 5 x 9 mm, demonstrating posterior acoustic enhancement and no internal power Doppler flow, corresponding to the mammographic finding. There is an adjacent smaller benign cyst at this location. No suspicious solid mass or abnormal acoustic shadowing is identified. RIGHT: No findings suspicious for malignancy. On correlative physical exam, the cyst in the INNER  breast is palpable, and is located on top of the costal cartilage of an ANTERIOR rib. IMPRESSION: 1. No mammographic or sonographic evidence of malignancy involving the LEFT breast. 2. No mammographic evidence of malignancy involving the RIGHT breast. 3. Benign cysts in the INNER LEFT breast (accounting for the palpable concern) and in the UPPER OUTER QUADRANT of the LEFT breast (accounting for a mammographic finding). RECOMMENDATION: Screening mammogram at age 76 unless there are persistent or subsequent clinical concerns. (Code:SM-B-40A) I have discussed the findings and recommendations with the patient. BI-RADS CATEGORY  2: Benign. Electronically Signed   By: Debby Satterfield M.D.   On: 04/22/2020 10:59   Pelvic/Bimanual Pap is not indicated today per BCCCP guidelines.   Smoking History: Patient quit smoking in 2022 and then began vaping. Discussed smoking cessation. Referred to the [facility] Quitline.   Patient Navigation: Patient education provided. Access to services provided for patient through BCCCP program.   Breast and Cervical Cancer Risk Assessment: Patient does not have family history of breast cancer, known genetic mutations, or radiation treatment to the chest before age 49. Patient does not have history of cervical dysplasia, immunocompromised, or DES exposure in-utero.  Risk Scores as of Encounter on 12/31/2023     Alisa           5-year 0.5%   Lifetime 11.2%            Last calculated by Silas, Ansyi K, CMA on 12/31/2023 at  2:43 PM        A: BCCCP exam without pap smear No complaints.  P: Referred patient to the [facility] for a diagnostic mammogram. Appointment scheduled Monday, December 31, 2023 at 1500.  Driscilla Wanda SQUIBB, RN 12/31/2023 3:25 PM

## 2023-12-31 NOTE — Patient Instructions (Signed)
 Explained breast self awareness with [place]. Patient did not need a Pap smear today due to last Pap smear was 02/02/2022. Let her know BCCCP will cover Pap smears every 3 years unless has a history of abnormal Pap smears. Referred patient to the [facility] for a diagnostic mammogram. Appointment scheduled Monday, December 31, 2023 at 1500. Patient aware of appointment and will be there. Laymon Jointer Liddicoat verbalized understanding.  Lenox Ladouceur, Wanda Ship, RN 3:25 PM

## 2024-01-01 ENCOUNTER — Other Ambulatory Visit: Payer: Self-pay

## 2024-01-01 DIAGNOSIS — N6452 Nipple discharge: Secondary | ICD-10-CM

## 2024-01-01 NOTE — Addendum Note (Signed)
 Addended by: Jorey Dollard E on: 01/01/2024 11:24 AM   Modules accepted: Orders

## 2024-01-02 ENCOUNTER — Ambulatory Visit: Payer: Self-pay

## 2024-01-02 ENCOUNTER — Ambulatory Visit
Admission: RE | Admit: 2024-01-02 | Discharge: 2024-01-02 | Disposition: A | Discharge status: Home / Self Care | Payer: Self-pay | Source: Ambulatory Visit | Attending: Obstetrics and Gynecology | Admitting: Obstetrics and Gynecology

## 2024-01-02 DIAGNOSIS — N6452 Nipple discharge: Secondary | ICD-10-CM | POA: Diagnosis present

## 2024-01-02 MED ORDER — GADOBUTROL 1 MMOL/ML IV SOLN
4.0000 mL | Freq: Once | INTRAVENOUS | Status: AC | PRN
  Administered 2024-01-02: 4 mL via INTRAVENOUS

## 2024-01-05 ENCOUNTER — Encounter: Payer: Self-pay | Admitting: Oncology

## 2024-01-07 ENCOUNTER — Ambulatory Visit: Payer: Self-pay | Admitting: *Deleted

## 2024-01-07 NOTE — Progress Notes (Signed)
 Hello,  The MRI is recommended due to her complaint of spontaneous bloody nipple discharge from her left breast.  Thanks, Wanda
